# Patient Record
Sex: Female | Born: 1960
Health system: Southern US, Community
[De-identification: ages and names within clinical notes are randomized; demographics above are authoritative.]

---

## 2008-10-26 ENCOUNTER — Emergency Department (HOSPITAL_COMMUNITY): Admission: EM | Admit: 2008-10-26 | Discharge: 2008-10-26 | Payer: Self-pay | Admitting: Emergency Medicine

## 2011-08-22 LAB — CBC
MCHC: 34.3 g/dL (ref 30.0–36.0)
MCV: 93.8 fL (ref 78.0–100.0)
Platelets: 242 10*3/uL (ref 150–400)
RBC: 4 MIL/uL (ref 3.87–5.11)
RDW: 12.7 % (ref 11.5–15.5)

## 2011-08-22 LAB — DIFFERENTIAL
Basophils Absolute: 0 10*3/uL (ref 0.0–0.1)
Basophils Relative: 0 % (ref 0–1)
Eosinophils Absolute: 0.1 10*3/uL (ref 0.0–0.7)
Monocytes Relative: 6 % (ref 3–12)
Neutro Abs: 4.6 10*3/uL (ref 1.7–7.7)
Neutrophils Relative %: 68 % (ref 43–77)

## 2011-08-22 LAB — BASIC METABOLIC PANEL
BUN: 8 mg/dL (ref 6–23)
CO2: 26 mEq/L (ref 19–32)
Calcium: 8.9 mg/dL (ref 8.4–10.5)
Chloride: 107 mEq/L (ref 96–112)
Creatinine, Ser: 0.78 mg/dL (ref 0.4–1.2)
GFR calc Af Amer: >60 mL/min (ref >60)
Glucose, Bld: 118 mg/dL — ABNORMAL HIGH (ref 70–99)

## 2011-08-22 LAB — POCT CARDIAC MARKERS
CKMB, poc: 1.2 ng/mL (ref 1.0–8.0)
Troponin i, poc: <0.05 ng/mL (ref 0.00–0.09)
Troponin i, poc: <0.05 ng/mL (ref 0.00–0.09)

## 2011-08-22 LAB — MAGNESIUM: Magnesium: 2.2 mg/dL (ref 1.5–2.5)

## 2018-07-20 DIAGNOSIS — I491 Atrial premature depolarization: Secondary | ICD-10-CM | POA: Diagnosis not present

## 2018-07-20 DIAGNOSIS — I493 Ventricular premature depolarization: Secondary | ICD-10-CM | POA: Diagnosis not present

## 2018-07-20 DIAGNOSIS — R002 Palpitations: Secondary | ICD-10-CM | POA: Diagnosis not present

## 2018-07-27 DIAGNOSIS — I493 Ventricular premature depolarization: Secondary | ICD-10-CM | POA: Diagnosis not present

## 2018-07-27 DIAGNOSIS — E032 Hypothyroidism due to medicaments and other exogenous substances: Secondary | ICD-10-CM | POA: Diagnosis not present

## 2018-07-30 DIAGNOSIS — Z1231 Encounter for screening mammogram for malignant neoplasm of breast: Secondary | ICD-10-CM | POA: Diagnosis not present

## 2018-08-09 DIAGNOSIS — L821 Other seborrheic keratosis: Secondary | ICD-10-CM | POA: Diagnosis not present

## 2018-08-09 DIAGNOSIS — L700 Acne vulgaris: Secondary | ICD-10-CM | POA: Diagnosis not present

## 2018-08-09 DIAGNOSIS — D225 Melanocytic nevi of trunk: Secondary | ICD-10-CM | POA: Diagnosis not present

## 2018-08-09 DIAGNOSIS — D2371 Other benign neoplasm of skin of right lower limb, including hip: Secondary | ICD-10-CM | POA: Diagnosis not present

## 2018-10-20 DIAGNOSIS — E032 Hypothyroidism due to medicaments and other exogenous substances: Secondary | ICD-10-CM | POA: Diagnosis not present

## 2018-10-20 DIAGNOSIS — I493 Ventricular premature depolarization: Secondary | ICD-10-CM | POA: Diagnosis not present

## 2018-10-20 DIAGNOSIS — R002 Palpitations: Secondary | ICD-10-CM | POA: Diagnosis not present

## 2018-10-25 DIAGNOSIS — E559 Vitamin D deficiency, unspecified: Secondary | ICD-10-CM | POA: Diagnosis not present

## 2018-10-25 DIAGNOSIS — E039 Hypothyroidism, unspecified: Secondary | ICD-10-CM | POA: Diagnosis not present

## 2018-10-25 DIAGNOSIS — R5383 Other fatigue: Secondary | ICD-10-CM | POA: Diagnosis not present

## 2018-10-25 DIAGNOSIS — N951 Menopausal and female climacteric states: Secondary | ICD-10-CM | POA: Diagnosis not present

## 2018-10-25 DIAGNOSIS — E538 Deficiency of other specified B group vitamins: Secondary | ICD-10-CM | POA: Diagnosis not present

## 2018-11-01 DIAGNOSIS — N951 Menopausal and female climacteric states: Secondary | ICD-10-CM | POA: Diagnosis not present

## 2018-11-01 DIAGNOSIS — E538 Deficiency of other specified B group vitamins: Secondary | ICD-10-CM | POA: Diagnosis not present

## 2018-11-01 DIAGNOSIS — E039 Hypothyroidism, unspecified: Secondary | ICD-10-CM | POA: Diagnosis not present

## 2018-11-01 DIAGNOSIS — R7989 Other specified abnormal findings of blood chemistry: Secondary | ICD-10-CM | POA: Diagnosis not present

## 2019-01-03 DIAGNOSIS — Z01419 Encounter for gynecological examination (general) (routine) without abnormal findings: Secondary | ICD-10-CM | POA: Diagnosis not present

## 2019-01-03 DIAGNOSIS — Z6830 Body mass index (BMI) 30.0-30.9, adult: Secondary | ICD-10-CM | POA: Diagnosis not present

## 2019-01-03 DIAGNOSIS — Z0000 Encounter for general adult medical examination without abnormal findings: Secondary | ICD-10-CM | POA: Diagnosis not present

## 2019-03-01 DIAGNOSIS — I493 Ventricular premature depolarization: Secondary | ICD-10-CM | POA: Diagnosis not present

## 2019-03-01 DIAGNOSIS — E032 Hypothyroidism due to medicaments and other exogenous substances: Secondary | ICD-10-CM | POA: Diagnosis not present

## 2019-03-01 DIAGNOSIS — R42 Dizziness and giddiness: Secondary | ICD-10-CM | POA: Diagnosis not present

## 2019-03-01 DIAGNOSIS — R002 Palpitations: Secondary | ICD-10-CM | POA: Diagnosis not present

## 2019-07-20 DIAGNOSIS — E039 Hypothyroidism, unspecified: Secondary | ICD-10-CM | POA: Diagnosis not present

## 2019-07-20 DIAGNOSIS — R5383 Other fatigue: Secondary | ICD-10-CM | POA: Diagnosis not present

## 2019-07-20 DIAGNOSIS — N951 Menopausal and female climacteric states: Secondary | ICD-10-CM | POA: Diagnosis not present

## 2019-07-20 DIAGNOSIS — E559 Vitamin D deficiency, unspecified: Secondary | ICD-10-CM | POA: Diagnosis not present

## 2019-07-20 DIAGNOSIS — E538 Deficiency of other specified B group vitamins: Secondary | ICD-10-CM | POA: Diagnosis not present

## 2019-07-20 DIAGNOSIS — E348 Other specified endocrine disorders: Secondary | ICD-10-CM | POA: Diagnosis not present

## 2019-07-29 DIAGNOSIS — R7989 Other specified abnormal findings of blood chemistry: Secondary | ICD-10-CM | POA: Diagnosis not present

## 2019-07-29 DIAGNOSIS — E559 Vitamin D deficiency, unspecified: Secondary | ICD-10-CM | POA: Diagnosis not present

## 2019-07-29 DIAGNOSIS — E039 Hypothyroidism, unspecified: Secondary | ICD-10-CM | POA: Diagnosis not present

## 2019-07-29 DIAGNOSIS — E538 Deficiency of other specified B group vitamins: Secondary | ICD-10-CM | POA: Diagnosis not present

## 2019-08-09 DIAGNOSIS — D485 Neoplasm of uncertain behavior of skin: Secondary | ICD-10-CM | POA: Diagnosis not present

## 2019-08-09 DIAGNOSIS — D225 Melanocytic nevi of trunk: Secondary | ICD-10-CM | POA: Diagnosis not present

## 2019-08-09 DIAGNOSIS — D2372 Other benign neoplasm of skin of left lower limb, including hip: Secondary | ICD-10-CM | POA: Diagnosis not present

## 2019-08-09 DIAGNOSIS — E508 Other manifestations of vitamin A deficiency: Secondary | ICD-10-CM | POA: Diagnosis not present

## 2019-08-23 DIAGNOSIS — M7662 Achilles tendinitis, left leg: Secondary | ICD-10-CM | POA: Diagnosis not present

## 2019-12-08 DIAGNOSIS — N951 Menopausal and female climacteric states: Secondary | ICD-10-CM | POA: Diagnosis not present

## 2019-12-08 DIAGNOSIS — R5383 Other fatigue: Secondary | ICD-10-CM | POA: Diagnosis not present

## 2019-12-08 DIAGNOSIS — E559 Vitamin D deficiency, unspecified: Secondary | ICD-10-CM | POA: Diagnosis not present

## 2019-12-08 DIAGNOSIS — E039 Hypothyroidism, unspecified: Secondary | ICD-10-CM | POA: Diagnosis not present

## 2019-12-08 DIAGNOSIS — E538 Deficiency of other specified B group vitamins: Secondary | ICD-10-CM | POA: Diagnosis not present

## 2019-12-15 DIAGNOSIS — E039 Hypothyroidism, unspecified: Secondary | ICD-10-CM | POA: Diagnosis not present

## 2019-12-15 DIAGNOSIS — R7989 Other specified abnormal findings of blood chemistry: Secondary | ICD-10-CM | POA: Diagnosis not present

## 2019-12-15 DIAGNOSIS — E538 Deficiency of other specified B group vitamins: Secondary | ICD-10-CM | POA: Diagnosis not present

## 2019-12-15 DIAGNOSIS — F419 Anxiety disorder, unspecified: Secondary | ICD-10-CM | POA: Diagnosis not present

## 2019-12-26 ENCOUNTER — Encounter: Payer: Self-pay | Admitting: Sports Medicine

## 2019-12-26 ENCOUNTER — Other Ambulatory Visit: Payer: Self-pay

## 2019-12-26 ENCOUNTER — Ambulatory Visit (INDEPENDENT_AMBULATORY_CARE_PROVIDER_SITE_OTHER): Payer: BC Managed Care – PPO | Admitting: Sports Medicine

## 2019-12-26 DIAGNOSIS — M67972 Unspecified disorder of synovium and tendon, left ankle and foot: Secondary | ICD-10-CM | POA: Diagnosis not present

## 2019-12-26 DIAGNOSIS — M7752 Other enthesopathy of left foot: Secondary | ICD-10-CM | POA: Insufficient documentation

## 2019-12-26 MED ORDER — NITROGLYCERIN 0.2 MG/HR TD PT24: MEDICATED_PATCH | TRANSDERMAL | 11 refills | Status: AC

## 2019-12-26 NOTE — Progress Notes (Signed)
    Procedures performed today:    None.  Independent interpretation of tests performed by another provider:   None.  Impression and Recommendations:    Insertional Achilles tendinopathy, left For 6 months this pleasant 59 year old female has had pain at the left Achilles insertion, she did see a podiatrist and had some x-rays. Starting relatively conservatively, heel lifts, formal physical therapy, topical nitroglycerin. Return to see me in 1 month, I did advise her that it could take 8+ weeks to really feel a response.    ___________________________________________ Ihor Austin. Benjamin Stain, M.D., ABFM., CAQSM. Primary Care and Sports Medicine [facility]  Adjunct Instructor of Family Medicine  [employer]

## 2019-12-26 NOTE — Assessment & Plan Note (Signed)
For 6 months this pleasant 59 year old female has had pain at the left Achilles insertion, she did see a podiatrist and had some x-rays. Starting relatively conservatively, heel lifts, formal physical therapy, topical nitroglycerin. Return to see me in 1 month, I did advise her that it could take 8+ weeks to really feel a response.

## 2019-12-26 NOTE — Patient Instructions (Signed)
Begin with easy walking, heel, toe and backwards  Do calf raises on a step:  First lower and then raise on 1 foot  If this is painful, lower on 1 foot, do the heel raise on both feet Begin with 3 sets of 10 repetitions  Increase by 5 repetitions every 3 days  Goal is 3 sets of 30 repetitions  Do with both straight knee and knee at 20 degrees of flexion  If pain persists, once you can do 3 sets of 30 without weight, add backpack with 5 lbs.  Increase by 5 lbs per week to max of 30 lbs for 3 sets of 15   

## 2020-01-02 ENCOUNTER — Other Ambulatory Visit: Payer: Self-pay

## 2020-01-02 ENCOUNTER — Ambulatory Visit (INDEPENDENT_AMBULATORY_CARE_PROVIDER_SITE_OTHER): Payer: BC Managed Care – PPO | Admitting: Rehabilitative and Restorative Service Providers"

## 2020-01-02 DIAGNOSIS — M6281 Muscle weakness (generalized): Secondary | ICD-10-CM

## 2020-01-02 DIAGNOSIS — M25572 Pain in left ankle and joints of left foot: Secondary | ICD-10-CM

## 2020-01-02 DIAGNOSIS — R2689 Other abnormalities of gait and mobility: Secondary | ICD-10-CM

## 2020-01-02 NOTE — Patient Instructions (Signed)
Access Code: [email or web address]  URL: [email or web address]  Date: 01/02/2020  Prepared by: Margretta Ditty   Exercises Long Sitting Calf Stretch with Strap - 3 reps - 1 sets - 30 seconds hold - 2x daily - 7x weekly Calf Mobilization with Foam Roll - 1 reps - 1 sets - 2x daily - 7x weekly Sidelying TFL Stretch - 3 reps - 1 sets - 30 seconds hold - 2x daily                            - 7x weekly

## 2020-01-02 NOTE — Therapy (Signed)
[facility] [place] [address] [place], [place], [address] Phone: [phone]   Fax:  [phone]  Physical Therapy Evaluation  Patient Details  Name: Gina Morrison MRN: [id number] Date of Birth: 03-21-1961 Referring Provider (PT): Monica Becton, MD   Encounter Date: 01/02/2020  PT End of Session - 01/02/20 2235    Visit Number  1    Number of Visits  12    Date for PT Re-Evaluation  02/13/20    PT Start Time  0848    PT Stop Time  0934    PT Time Calculation (min)  46 min    Activity Tolerance  Patient tolerated treatment well    Behavior During Therapy  [facility] for tasks assessed/performed       No past medical history on file. There were no vitals filed for this visit.   Subjective Assessment - 01/02/20 0854    Subjective  The patient reports onset of L heel pain in August 2020.  She has pushed through the pain trying ice and stretching.  She recently was prescribed nitro patches to increase blood flow.  She saw a podiatrist and was provided stretching to treat.  She works out with zumba and an exercise bike at home.  The pain is worse going down steps (she has to walk sideways) noting the ankle doesn't feel steady.  She does zumba 2 days/week in a 60-75 minute class.  She feels the R knee is painful due to compensations for L antalgic gait.    Patient Stated Goals  Reduce pain in the L ankle "I want to be able to run if I need to".  She notes tired of alternating how she does things in life.    Currently in Pain?  Yes    Pain Location  Ankle    Pain Orientation  Left    Pain Descriptors / Indicators  Aching;Tightness;Burning;Discomfort    Aggravating Factors   first thing in morning, and after sitting for long periods.  "I can hardly do a flat shoe"    Pain Relieving Factors  as she gets going during the day, it stretches         [facility] PT Assessment - 01/02/20 0901      Assessment   Medical Diagnosis  M67.972  (ICD-10-CM) - Achilles tendon disorder, left    Referring Provider (PT)  Monica Becton, MD    Onset Date/Surgical Date  12/26/19    Prior Therapy  home program from podiatrist in past      Precautions   Precautions  None      Restrictions   Weight Bearing Restrictions  No    Other Position/Activity Restrictions  no      Balance Screen   Has the patient fallen in the past 6 months  No    Has the patient had a decrease in activity level because of a fear of falling?   No    Is the patient reluctant to leave their home because of a fear of falling?   No      Home Environment   Living Environment  Private residence    Living Arrangements  Spouse/significant other;Children    Type of Home  House    Home Layout  Laundry or work area in basement;One level    Home Equipment  None      Prior Function   Level of Independence  Independent    Vocation  Part time employment  Vocation Requirements  travel, standing up for retail    Leisure  zumba, bike      Observation/Other Assessments   Focus on Therapeutic Outcomes (FOTO)   36% limited      Observation/Other Assessments-Edema    Edema  --   mild edema noted lateral to achilles tendon     Sensation   Light Touch  Appears Intact      ROM / Strength   AROM / PROM / Strength  AROM;Strength      AROM   Overall AROM   Deficits    Overall AROM Comments  Patient is able to dorsiflex the L ankle however c/o pain and tightness in L heel cord.    AROM Assessment Site  --    Right/Left Hip  --    Right/Left Ankle  --      Strength   Strength Assessment Site  Hip;Knee;Ankle    Right/Left Hip  Right;Left    Right Hip Flexion  4/5    Left Hip Flexion  4/5    Right/Left Knee  Right;Left    Right Knee Flexion  5/5    Right Knee Extension  5/5    Left Knee Flexion  5/5    Left Knee Extension  5/5    Right/Left Ankle  Right;Left    Right Ankle Dorsiflexion  5/5    Right Ankle Inversion  5/5    Right Ankle Eversion  5/5     Left Ankle Dorsiflexion  5/5    Left Ankle Plantar Flexion  2+/5   unable to perform single leg heel raise   Left Ankle Inversion  5/5    Left Ankle Eversion  5/5      Flexibility   Soft Tissue Assessment /Muscle Length  yes    ITB  tightness noted with trigger points palpated L ITB      Palpation   Palpation comment  L ankle joint mobility limited in heel eversion and in AP/PA glides.  Patient point tender lateral to achilles tendon with area of edema noted.      Ambulation/Gait   Ambulation/Gait  Yes    Ambulation/Gait Assistance  6: Modified independent (Device/Increase time)    Gait Pattern  Decreased stance time - left;Antalgic                Objective measurements completed on examination: See above findings.      [facility] Adult PT Treatment/Exercise - 01/02/20 0902      Self-Care   Self-Care  Other Self-Care Comments    Other Self-Care Comments   modification of zumba in order to avoid high intensity to allow time to reduce inflammation; ice massage for achilles tendon, foam rolling (or using pastry roller) for gastroc and soleous trigger points      Exercises   Exercises  Ankle;Knee/Hip      Knee/Hip Exercises: Stretches   ITB Stretch  Left;2 reps;30 seconds      Ankle Exercises: Stretches   Gastroc Stretch  2 reps;30 seconds    Gastroc Stretch Limitations  in seated position with belt to avoid overstretching    Other Stretch  Attempted ankle self mobilization in 1/2 kneeling adding anterior weight shift (did not provide for HEP due to R knee discomfort -- used foam under knee and still painful).             PT Education - 01/02/20 2235    Education Details  HEP    Person(s) Educated  Patient  Methods  Explanation;Demonstration;Handout    Comprehension  Returned demonstration;Verbalized understanding          PT Long Term Goals - 01/02/20 2302      PT LONG TERM GOAL #1   Title  The patient will be indep with HEP.    Time  6    Period   Weeks    Target Date  02/13/20      PT LONG TERM GOAL #2   Title  The patient will reduce functional limitation per FOTO from 36% to < or equal to 28% to demonstrate improving functional mobility.    Time  6    Period  Weeks    Target Date  02/13/20      PT LONG TERM GOAL #3   Title  The patient will report pain in the morning < or equal to 4/10.    Time  6    Period  Weeks    Target Date  02/13/20      PT LONG TERM GOAL #4   Title  The patient will return demonstrate 5 heel raises in left single limb stance.    Time  6    Period  Weeks    Target Date  02/13/20      PT LONG TERM GOAL #5   Title  The patient will report ability to negotiate steps in a reciprocal pattern without turning sideways to avoid L heel pain.    Time  6    Period  Weeks    Target Date  02/13/20             Plan - 01/02/20 2244    Clinical Impression Statement  The patient is a 59 year old female with chronic L achilles tendonitis presenting to physical therapy with impairments in muscle length gastrocs and soleous, tissue mobility of L ITBand and lower leg, joint mobility of the L ankle, and strength of the L plantar flexors.  This leads to funtional limitations of dec'd standing tolerance for work related activities, difficulty walking after prolonged sitting or riding in a car, and pain with walking intermittently.  The patient is also limited in her exercise routine due to L heel pain.  PT to address deficits to return to reduce pain and return to prior functional status.    Examination-Activity Limitations  Stairs;Locomotion Level    Examination-Participation Restrictions  Community Activity    Stability/Clinical Decision Making  Stable/Uncomplicated    Clinical Decision Making  Low    Rehab Potential  Good    PT Frequency  2x / week    PT Duration  6 weeks    PT Treatment/Interventions  ADLs/Self Care Home Management;Gait training;Iontophoresis 4mg /ml Dexamethasone;Cryotherapy;Moist Heat;Manual  techniques;Taping;Dry needling;Passive range of motion;Patient/family education;Functional mobility training;Stair training;Therapeutic activities;Therapeutic exercise;Neuromuscular re-education    PT Next Visit Plan  document AROM ankle;  ankle joint mobilization, dry needling and STM L gastroc and L soleous, L distal LE strengthening, ITB stretching    PT Home Exercise Plan  Access Code: [email or web address]    Consulted and Agree with Plan of Care  Patient       Patient will benefit from skilled therapeutic intervention in order to improve the following deficits and impairments:  Decreased range of motion, Decreased strength, Impaired flexibility, Pain, Difficulty walking, Increased fascial restricitons, Decreased activity tolerance, Hypomobility  Visit Diagnosis: Pain in left ankle and joints of left foot  Muscle weakness (generalized)  Other abnormalities of gait and mobility  Problem List Patient Active Problem List   Diagnosis Date Noted  . Insertional Achilles tendinopathy, left 12/26/2019    Patrisha Hausmann, PT 01/02/2020, 11:05 PM  [facility] [address] [place], [place], [address] Phone: [phone]   Fax:  [phone]  Name: Gina Morrison MRN: [id number] Date of Birth: [birth year]

## 2020-01-05 ENCOUNTER — Encounter: Payer: BC Managed Care – PPO | Admitting: Physical Therapy

## 2020-01-09 ENCOUNTER — Encounter: Payer: Self-pay | Admitting: Rehabilitative and Restorative Service Providers"

## 2020-01-09 ENCOUNTER — Ambulatory Visit (INDEPENDENT_AMBULATORY_CARE_PROVIDER_SITE_OTHER): Payer: BC Managed Care – PPO | Admitting: Rehabilitative and Restorative Service Providers"

## 2020-01-09 ENCOUNTER — Other Ambulatory Visit: Payer: Self-pay

## 2020-01-09 DIAGNOSIS — M25572 Pain in left ankle and joints of left foot: Secondary | ICD-10-CM

## 2020-01-09 DIAGNOSIS — M6281 Muscle weakness (generalized): Secondary | ICD-10-CM

## 2020-01-09 DIAGNOSIS — R2689 Other abnormalities of gait and mobility: Secondary | ICD-10-CM | POA: Diagnosis not present

## 2020-01-09 NOTE — Patient Instructions (Signed)
Access Code: [email or web address]  URL: [email or web address]  Date: 01/09/2020  Prepared by: Corlis Leak   Exercises  Long Sitting Calf Stretch with Strap - 3 reps - 1 sets - 30 seconds hold - 2x daily - 7x weekly  Calf Mobilization with Foam Roll - 1 reps - 1 sets - 2x daily - 7x weekly  Sidelying TFL Stretch - 3 reps - 1 sets - 30 seconds hold - 2x daily - 7x weekly  Gastroc Stretch on Wall - 3 reps - 1 sets - 30 sec hold - 2x daily - 7x weekly  Soleus Stretch on Wall - 3 reps - 1 sets - 30 sec hold - 2x daily - 7x weekly  Seated Hip Flexor Stretch - 3 reps - 1 sets - 30 sec hold - 2x daily - 7x weekly   Trigger Point Dry Needling  . What is Trigger Point Dry Needling (DN)? o DN is a physical therapy technique used to treat muscle pain and dysfunction. Specifically, DN helps deactivate muscle trigger points (muscle knots).  o A thin filiform needle is used to penetrate the skin and stimulate the underlying trigger point. The goal is for a local twitch response (LTR) to occur and for the trigger point to relax. No medication of any kind is injected during the procedure.   . What Does Trigger Point Dry Needling Feel Like?  o The procedure feels different for each individual patient. Some patients report that they do not actually feel the needle enter the skin and overall the process is not painful. Very mild bleeding may occur. However, many patients feel a deep cramping in the muscle in which the needle was inserted. This is the local twitch response.   Marland Kitchen How Will I feel after the treatment? o Soreness is normal, and the onset of soreness may not occur for a few hours. Typically this soreness does not last longer than two days.  o Bruising is uncommon, however; ice can be used to decrease any possible bruising.  o In rare cases feeling tired or nauseous after the treatment is normal. In addition, your symptoms may get worse before they get better, this period will typically not last  longer than 24 hours.   . What Can I do After My Treatment? o Increase your hydration by drinking more water for the next 24 hours. o You may place ice or heat on the areas treated that have become sore, however, do not use heat on inflamed or bruised areas. Heat often brings more relief post needling. o You can continue your regular activities, but vigorous activity is not recommended initially after the treatment for 24 hours. o DN is best combined with other physical therapy such as strengthening, stretching, and other therapies.

## 2020-01-09 NOTE — Therapy (Signed)
[facility] [address] [place], [place], [address] Phone: [phone]   Fax:  [phone]  Physical Therapy Treatment  Patient Details  Name: Gina Morrison MRN: [id number] Date of Birth: 06-13-1961 Referring Provider (PT): Silverio Decamp, MD   Encounter Date: 01/09/2020  PT End of Session - 01/09/20 3664    Visit Number  2    Number of Visits  12    Date for PT Re-Evaluation  02/13/20    PT Start Time  0932    PT Stop Time  1016    PT Time Calculation (min)  44 min    Activity Tolerance  Patient tolerated treatment well       History reviewed. No pertinent past medical history.  History reviewed. No pertinent surgical history.  There were no vitals filed for this visit.  Subjective Assessment - 01/09/20 0939    Subjective  No change in symptoms - has been doing her exercises and ice massage and riding her stationary bike which feels good. Using nitro patches for heel cord    Currently in Pain?  Yes    Pain Score  7     Pain Orientation  Left    Pain Descriptors / Indicators  Aching;Tightness;Burning;Discomfort    Pain Type  Chronic pain                       [facility] Adult PT Treatment/Exercise - 01/09/20 0001      Knee/Hip Exercises: Stretches   Hip Flexor Stretch  Left;Right;3 reps;30 seconds   sitting     Knee/Hip Exercises: Aerobic   Nustep  L5 x 4 min UE 8       Moist Heat Therapy   Number Minutes Moist Heat  8 Minutes    Moist Heat Location  Other (comment)   Lt calf      Manual Therapy   Manual therapy comments  pr prone LE on bolster     Soft tissue mobilization  deep tissue work/IASTM Lt gastroc/soleus complex     Myofascial Release  Lt calf       Ankle Exercises: Stretches   Soleus Stretch  2 reps;30 seconds   standing    Gastroc Stretch  2 reps;30 seconds    Gastroc Stretch Limitations  standing     Other Stretch  hip flexor stretch seated 30 sec x 2 each side         Trigger Point Dry Needling - 01/09/20 0001    Consent Given?  Yes    Education Handout Provided  Yes    Dry Needling Comments  pt prone LE on bolster DN Lt only     Gastrocnemius Response  Palpable increased muscle length    Soleus Response  Palpable increased muscle length           PT Education - 01/09/20 0950    Education Details  HEP DN    Person(s) Educated  Patient    Methods  Explanation;Demonstration;Tactile cues;Verbal cues;Handout    Comprehension  Verbalized understanding;Returned demonstration;Verbal cues required;Tactile cues required          PT Long Term Goals - 01/02/20 2302      PT LONG TERM GOAL #1   Title  The patient will be indep with HEP.    Time  6    Period  Weeks    Target Date  02/13/20      PT LONG TERM GOAL #2  Title  The patient will reduce functional limitation per FOTO from 36% to < or equal to 28% to demonstrate improving functional mobility.    Time  6    Period  Weeks    Target Date  02/13/20      PT LONG TERM GOAL #3   Title  The patient will report pain in the morning < or equal to 4/10.    Time  6    Period  Weeks    Target Date  02/13/20      PT LONG TERM GOAL #4   Title  The patient will return demonstrate 5 heel raises in left single limb stance.    Time  6    Period  Weeks    Target Date  02/13/20      PT LONG TERM GOAL #5   Title  The patient will report ability to negotiate steps in a reciprocal pattern without turning sideways to avoid L heel pain.    Time  6    Period  Weeks    Target Date  02/13/20            Plan - 01/09/20 0945    Clinical Impression Statement  No change with HEP. Tolerated DN/manual work and additional exercises well. Noted tightness and TP mid to lateral gastroc/soleus complex. Good response to treatment.    Rehab Potential  Good    PT Frequency  2x / week    PT Duration  6 weeks    PT Treatment/Interventions  ADLs/Self Care Home Management;Gait training;Iontophoresis  4mg /ml Dexamethasone;Cryotherapy;Moist Heat;Manual techniques;Taping;Dry needling;Passive range of motion;Patient/family education;Functional mobility training;Stair training;Therapeutic activities;Therapeutic exercise;Neuromuscular re-education    PT Next Visit Plan  document AROM ankle;  ankle joint mobilization, assess response to dry needling and STM L gastroc and L soleous, L distal LE strengthening and TB stretching as indicated    PT Home Exercise Plan  Access Code: [email or web address]    Consulted and Agree with Plan of Care  Patient       Patient will benefit from skilled therapeutic intervention in order to improve the following deficits and impairments:     Visit Diagnosis: Pain in left ankle and joints of left foot  Muscle weakness (generalized)  Other abnormalities of gait and mobility     Problem List Patient Active Problem List   Diagnosis Date Noted  . Insertional Achilles tendinopathy, left 12/26/2019    Paulanthony Gleaves 02/23/2020 PT, MPH  01/09/2020, 10:15 AM  [facility] [address] [place], [place], [place] Phone: [phone]   Fax:  [phone]  Name: Haroldine Redler Wittler MRN: Emilio Math Date of Birth: [birth year]

## 2020-01-12 ENCOUNTER — Encounter: Payer: Self-pay | Admitting: Rehabilitative and Restorative Service Providers"

## 2020-01-12 ENCOUNTER — Ambulatory Visit (INDEPENDENT_AMBULATORY_CARE_PROVIDER_SITE_OTHER): Payer: BC Managed Care – PPO | Admitting: Rehabilitative and Restorative Service Providers"

## 2020-01-12 ENCOUNTER — Other Ambulatory Visit: Payer: Self-pay

## 2020-01-12 DIAGNOSIS — M25572 Pain in left ankle and joints of left foot: Secondary | ICD-10-CM | POA: Diagnosis not present

## 2020-01-12 DIAGNOSIS — R2689 Other abnormalities of gait and mobility: Secondary | ICD-10-CM

## 2020-01-12 DIAGNOSIS — M6281 Muscle weakness (generalized): Secondary | ICD-10-CM

## 2020-01-12 NOTE — Patient Instructions (Addendum)
Access Code: [email or web address]  URL: [email or web address]  Date: 01/12/2020  Prepared by: Margretta Ditty   Exercises Long Sitting Calf Stretch with Strap - 3 reps - 1 sets - 30 seconds hold - 2x daily - 7x weekly Calf Mobilization with Foam Roll - 1 reps - 1 sets - 2x daily - 7x weekly Sidelying TFL Stretch - 3 reps - 1 sets - 30 seconds hold - 2x daily                            - 7x weekly Gastroc Stretch on Wall - 3 reps - 1 sets - 30 sec hold - 2x daily - 7x weekly Soleus Stretch on Wall - 3 reps - 1 sets - 30 sec hold - 2x daily - 7x weekly Seated Hip Flexor Stretch - 3 reps - 1 sets - 30 sec hold - 2x daily - 7x weekly Seated Ankle Alphabet - 10 reps - 1 sets - 2x daily - 7x weekly Seated Plantar Fascia Mobilization with Small Ball - 10 reps - 1 sets - 2x daily - 7x weekly  IONTOPHORESIS PATIENT PRECAUTIONS & CONTRAINDICATIONS   Redness under one or both electrodes can occur.  This is characterized by a uniform redness that usually disappears within 12 hours of treatment.  Small pinhead size blisters may result in response to the drug.  Contact your physician if the problem persists more than 24 hours.  On rare occasions, iontophoresis therapy can result in temporary skin reactions such as rash, inflammation, irritation or burns.  The skin reaction bay be the result of individual sensitivity to the ionic solution used, the condition of the skin at the start of treatment, reaction to the materials in the electrodes, allergies or sensitivity to dexamethasone, or a poor connection between the patch and your skin.  Discontinue using iontophoresis if you have any of these reactions and report to your therapist.  Remove the Patch or electrodes if you have any undue sensation of pain or burning during the treatment and report discomfort to your therapist.  Tell your Therapist if you have had known adverse reactions to the application of electrical current.  If using the Patch,  the LED light will turn off when treatment is complete and the patch can be removed.  Approximate treatment time is 1-3 hours.  Remove the patch when light goes off or after 6 hours.  The Patch can be worn during normal activity, however excessive motion where the electrodes have been placed can cause poor contact between the skin and the electrode or uneven electrical current resulting in greater risk of skin irritation.  Keep out of the reach of children.   DO NOT use if you have a cardiac pacemaker or any other electrically sensitive  Implanted device.  DO NOT use if you have known sensitivity to Dexamethasone.  DO NOT use during Magnetic Resonance Imaging (MRI).  DO NOT use over broken or compromised skin (e.g. Sunburn, cuts or acne) due to the increased risk of skin reaction.  DO NOT SHAVE over the area to be treated:  To establish good contact between the Patch and the skin excessive hair may be clipped.  DO NOT place the Patch or electrodes on or over your eyes, directly over your heart or brain.  DO NOT reuse the Patch or electrodes as this may cause burns to occur.

## 2020-01-12 NOTE — Therapy (Signed)
[place], [place], [address] Phone: [phone]   Fax:  [phone]  Physical Therapy Treatment  Patient Details  Name: Gina Morrison MRN: [id number] Date of Birth: 1960-12-14 Referring Provider (PT): Silverio Decamp, MD   Encounter Date: 01/12/2020  PT End of Session - 01/12/20 1056    Visit Number  3    Number of Visits  12    Date for PT Re-Evaluation  02/13/20    PT Start Time  0932    PT Stop Time  9242    PT Time Calculation (min)  42 min    Activity Tolerance  Patient tolerated treatment well       History reviewed. No pertinent past medical history.  History reviewed. No pertinent surgical history.  There were no vitals filed for this visit.  Subjective Assessment - 01/12/20 0933    Subjective  The patient reports she was sore for 2 days after dry needling.  She arrives with antalgic gait pattern.    Patient Stated Goals  Reduce pain in the L ankle "I want to be able to run if I need to".  She notes tired of alternating how she does things in life.    Currently in Pain?  Yes    Pain Score  --   no current pain at rest; gets excruciating burning at times   Pain Location  Ankle    Pain Descriptors / Indicators  Tender;Burning;Aching    Pain Type  Chronic pain    Pain Onset  More than a month ago         [facility] PT Assessment - 01/12/20 0944      AROM   Left Ankle Dorsiflexion  0    Left Ankle Plantar Flexion  45                   [facility] Adult PT Treatment/Exercise - 01/12/20 0941      Exercises   Exercises  Ankle      Modalities   Modalities  Iontophoresis      Iontophoresis   Type of Iontophoresis  Dexamethasone    Location  L heel cord    Dose  1.0cc    Time  12 hour patch      Manual Therapy   Manual Therapy  Joint mobilization;Soft tissue mobilization;Myofascial release    Manual therapy comments  Patient prone for STM and joint mobility and  then performed standing mobilization with movement    Joint Mobilization  PRONE: Grade II-III lateral ankle mortisse mobilization, AP and PA grade II-III, and grade IV subtalar mobilization   STANDING:  PT used gait belt around tibia/fibular complex and had patient step down with R LE from 3" step and stabilized ankle to get tibia movement along ankle mortisse    Soft tissue mobilization  deep tissue work IASTM in Horticulturist, commercial and soleous complex in prone and in SL (to better access soleous)    Myofascial Release  L calf      Ankle Exercises: Stretches   Plantar Fascia Stretch  30 seconds    Plantar Fascia Stretch Limitations  tennis ball    Other Stretch  Seated toe extensor stretch      Ankle Exercises: Seated   ABC's  1 rep    Ankle Circles/Pumps  AROM;Left;10 reps    BAPS  Sitting;Level 2;15 reps    BAPS Limitations  ant/post and laterally  PT Education - 01/12/20 1056    Education Details  added ankle alphabet, info on iontophoresis,  tennis ball plantar roll    Person(s) Educated  Patient    Methods  Explanation;Demonstration;Handout    Comprehension  Returned demonstration;Verbalized understanding          PT Long Term Goals - 01/02/20 2302      PT LONG TERM GOAL #1   Title  The patient will be indep with HEP.    Time  6    Period  Weeks    Target Date  02/13/20      PT LONG TERM GOAL #2   Title  The patient will reduce functional limitation per FOTO from 36% to < or equal to 28% to demonstrate improving functional mobility.    Time  6    Period  Weeks    Target Date  02/13/20      PT LONG TERM GOAL #3   Title  The patient will report pain in the morning < or equal to 4/10.    Time  6    Period  Weeks    Target Date  02/13/20      PT LONG TERM GOAL #4   Title  The patient will return demonstrate 5 heel raises in left single limb stance.    Time  6    Period  Weeks    Target Date  02/13/20      PT LONG TERM GOAL #5   Title  The patient will  report ability to negotiate steps in a reciprocal pattern without turning sideways to avoid L heel pain.    Time  6    Period  Weeks    Target Date  02/13/20            Plan - 01/12/20 1325    Clinical Impression Statement  The patient is point tender lateral to left heel cord and PT initiated iontophoresis (12 hr) to L heel cord to reduce inflammation.  She noted improved mobility after PT treatment today feeling soft tissue work and mobilization allows for greater movement.    Rehab Potential  Good    PT Frequency  2x / week    PT Duration  6 weeks    PT Treatment/Interventions  ADLs/Self Care Home Management;Gait training;Iontophoresis 4mg /ml Dexamethasone;Cryotherapy;Moist Heat;Manual techniques;Taping;Dry needling;Passive range of motion;Patient/family education;Functional mobility training;Stair training;Therapeutic activities;Therapeutic exercise;Neuromuscular re-education    PT Next Visit Plan  ankle joint mobilization, assess response to dry needling and STM L gastroc and L soleous, L distal LE strengthening and TB stretching as indicated    PT Home Exercise Plan  Access Code: [email or web address]    Consulted and Agree with Plan of Care  Patient       Patient will benefit from skilled therapeutic intervention in order to improve the following deficits and impairments:  Decreased range of motion, Decreased strength, Impaired flexibility, Pain, Difficulty walking, Increased fascial restricitons, Decreased activity tolerance, Hypomobility  Visit Diagnosis: Pain in left ankle and joints of left foot  Muscle weakness (generalized)  Other abnormalities of gait and mobility     Problem List Patient Active Problem List   Diagnosis Date Noted  . Insertional Achilles tendinopathy, left 12/26/2019    Kasandra Fehr, PT 01/12/2020, 1:29 PM  [facility] [address] [place], [place], [place] Phone: [phone]   Fax:   [phone]  Name: Gina Morrison MRN: Emilio Math Date of Birth: [birth year]

## 2020-01-18 ENCOUNTER — Ambulatory Visit (INDEPENDENT_AMBULATORY_CARE_PROVIDER_SITE_OTHER): Payer: BC Managed Care – PPO | Admitting: Physical Therapy

## 2020-01-18 ENCOUNTER — Encounter: Payer: Self-pay | Admitting: Physical Therapy

## 2020-01-18 ENCOUNTER — Other Ambulatory Visit: Payer: Self-pay

## 2020-01-18 DIAGNOSIS — M6281 Muscle weakness (generalized): Secondary | ICD-10-CM

## 2020-01-18 DIAGNOSIS — M25572 Pain in left ankle and joints of left foot: Secondary | ICD-10-CM | POA: Diagnosis not present

## 2020-01-18 DIAGNOSIS — R2689 Other abnormalities of gait and mobility: Secondary | ICD-10-CM

## 2020-01-18 NOTE — Therapy (Signed)
[place], [place], [address] Phone: [phone]   Fax:  [phone]  Physical Therapy Treatment  Patient Details  Name: Gina Morrison MRN: [id number] Date of Birth: 01-22-1961 Referring Provider (PT): Silverio Decamp, MD   Encounter Date: 01/18/2020  PT End of Session - 01/18/20 0934    Visit Number  4    Number of Visits  12    Date for PT Re-Evaluation  02/13/20    PT Start Time  0934    PT Stop Time  1022    PT Time Calculation (min)  48 min    Activity Tolerance  Patient tolerated treatment well    Behavior During Therapy  [facility] for tasks assessed/performed       History reviewed. No pertinent past medical history.  History reviewed. No pertinent surgical history.  There were no vitals filed for this visit.  Subjective Assessment - 01/18/20 0936    Subjective  Pt reports that her ankle is starting to go in the right direction - if afraid she may have had a bad reaction to the ionto patch at about 10 hrs on ( 12 hr patch ) She had an increase in her HR. Pt would like to hold off on having another patch for now.    Patient Stated Goals  Reduce pain in the L ankle "I want to be able to run if I need to".  She notes tired of alternating how she does things in life.    Currently in Pain?  Yes    Pain Score  --   starts at 8/10 decreases to 3/10 with loosening it up   Pain Location  Ankle    Pain Orientation  Left    Pain Descriptors / Indicators  Tightness    Pain Type  Chronic pain    Aggravating Factors   walking    Pain Relieving Factors  stretches and gently motion                       [facility] Adult PT Treatment/Exercise - 01/18/20 0001      Self-Care   Other Self-Care Comments   foam rolling on Lt gastroc       Knee/Hip Exercises: Stretches   Active Hamstring Stretch  Left   20 reps knee presses with  strap      Knee/Hip Exercises: Aerobic   Recumbent Bike   L2x6'      Modalities   Modalities  Moist Heat      Moist Heat Therapy   Number Minutes Moist Heat  12 Minutes    Moist Heat Location  --   Lt calf      Manual Therapy   Manual therapy comments  skilled palpation and montioring during DN    Soft tissue mobilization  STM to Lt gastroc/soleous and anterior tib       Trigger Point Dry Needling - 01/18/20 0001    Consent Given?  Yes    Education Handout Provided  Previously provided    Dry Needling Comments  pt prone LE DN Lt only     Electrical Stimulation Performed with Dry Needling  Yes    E-stim with Dry Needling Details  2 channels to Rt gastroc                PT Long Term Goals - 01/02/20 2302      PT LONG TERM GOAL #1  Title  The patient will be indep with HEP.    Time  6    Period  Weeks    Target Date  02/13/20      PT LONG TERM GOAL #2   Title  The patient will reduce functional limitation per FOTO from 36% to < or equal to 28% to demonstrate improving functional mobility.    Time  6    Period  Weeks    Target Date  02/13/20      PT LONG TERM GOAL #3   Title  The patient will report pain in the morning < or equal to 4/10.    Time  6    Period  Weeks    Target Date  02/13/20      PT LONG TERM GOAL #4   Title  The patient will return demonstrate 5 heel raises in left single limb stance.    Time  6    Period  Weeks    Target Date  02/13/20      PT LONG TERM GOAL #5   Title  The patient will report ability to negotiate steps in a reciprocal pattern without turning sideways to avoid L heel pain.    Time  6    Period  Weeks    Target Date  02/13/20            Plan - 01/18/20 1013    Clinical Impression Statement  Pt had good release with DN, able to have increased movement after and less pain.  She requests to hold on iontopatch for now.  She will continue to use her nitro patch.    Rehab Potential  Good    PT Frequency  2x / week    PT Duration  6 weeks    PT Treatment/Interventions   ADLs/Self Care Home Management;Gait training;Iontophoresis 4mg /ml Dexamethasone;Cryotherapy;Moist Heat;Manual techniques;Taping;Dry needling;Passive range of motion;Patient/family education;Functional mobility training;Stair training;Therapeutic activities;Therapeutic exercise;Neuromuscular re-education    PT Next Visit Plan  ankle joint mobilization, assess response to dry needling and STM L gastroc and L soleous, L distal LE strengthening and TB stretching as indicated    Consulted and Agree with Plan of Care  Patient       Patient will benefit from skilled therapeutic intervention in order to improve the following deficits and impairments:  Decreased range of motion, Decreased strength, Impaired flexibility, Pain, Difficulty walking, Increased fascial restricitons, Decreased activity tolerance, Hypomobility  Visit Diagnosis: Pain in left ankle and joints of left foot  Muscle weakness (generalized)  Other abnormalities of gait and mobility     Problem List Patient Active Problem List   Diagnosis Date Noted  . Insertional Achilles tendinopathy, left 12/26/2019    02/23/2020 PT  01/18/2020, 10:15 AM  [facility] [address] [place], [place], [place] Phone: [phone]   Fax:  [phone]  Name: Karessa Onorato Molyneux MRN: Emilio Math Date of Birth: 12/07/1960

## 2020-01-19 DIAGNOSIS — Z1231 Encounter for screening mammogram for malignant neoplasm of breast: Secondary | ICD-10-CM | POA: Diagnosis not present

## 2020-01-23 ENCOUNTER — Ambulatory Visit: Payer: BC Managed Care – PPO | Admitting: Sports Medicine

## 2020-01-25 ENCOUNTER — Ambulatory Visit (INDEPENDENT_AMBULATORY_CARE_PROVIDER_SITE_OTHER): Payer: BC Managed Care – PPO | Admitting: Physical Therapy

## 2020-01-25 ENCOUNTER — Other Ambulatory Visit: Payer: Self-pay

## 2020-01-25 ENCOUNTER — Encounter: Payer: Self-pay | Admitting: Physical Therapy

## 2020-01-25 DIAGNOSIS — R2689 Other abnormalities of gait and mobility: Secondary | ICD-10-CM

## 2020-01-25 DIAGNOSIS — M6281 Muscle weakness (generalized): Secondary | ICD-10-CM

## 2020-01-25 DIAGNOSIS — M25572 Pain in left ankle and joints of left foot: Secondary | ICD-10-CM

## 2020-01-25 NOTE — Therapy (Signed)
[place], [place], [address] Phone: [phone]   Fax:  [phone]  Physical Therapy Treatment  Patient Details  Name: Gina Morrison MRN: [id number] Date of Birth: 11-23-1960 Referring Provider (PT): Silverio Decamp, MD   Encounter Date: 01/25/2020  PT End of Session - 01/25/20 0935    Visit Number  5    Number of Visits  12    Date for PT Re-Evaluation  02/13/20    PT Start Time  0936    PT Stop Time  1017    PT Time Calculation (min)  41 min       History reviewed. No pertinent past medical history.  History reviewed. No pertinent surgical history.  There were no vitals filed for this visit.  Subjective Assessment - 01/25/20 0936    Subjective  Pt reports she is doing good however feels like the area is swollen.  Has been doing her exercise.  Driving made it a little sore    Patient Stated Goals  Reduce pain in the L ankle "I want to be able to run if I need to".  She notes tired of alternating how she does things in life.    Currently in Pain?  Yes    Pain Score  --   6/10 with walking, 3/10 with rest        [facility] PT Assessment - 01/25/20 0001      Assessment   Medical Diagnosis  M67.972 (ICD-10-CM) - Achilles tendon disorder, left    Referring Provider (PT)  Silverio Decamp, MD    Next MD Visit  needs to reschedule      AROM   Left Ankle Dorsiflexion  12                   [facility] Adult PT Treatment/Exercise - 01/25/20 0001      Knee/Hip Exercises: Aerobic   Recumbent Bike  L2x6'      Knee/Hip Exercises: Standing   SLS  Lt with FWD leans 3x10    Rebounder  Lt LE foot straight and 45 degrees each side      Modalities   Modalities  Ultrasound      Ultrasound   Ultrasound Location  Lt achilles    Ultrasound Parameters  3' static, 10%, 3.23mz, 0.80w/cm2, 4' 3.325m, 1.0w/cm2    Ultrasound Goals  Pain;Edema      Manual Therapy   Manual Therapy   Taping    Kinesiotex  Create Space;Inhibit Muscle      Kinesiotix   Create Space  i strip across Lt achilles    Inhibit Muscle   Y strip up Lt achilles      Ankle Exercises: Seated   BAPS  Sitting;Level 4   20 reps   BAPS Limitations  clock 6/12, 3/9 and circles. VC to isolate ankle motion and not use hip                  PT Long Term Goals - 01/25/20 0939      PT LONG TERM GOAL #1   Title  The patient will be indep with HEP.    Status  On-going      PT LONG TERM GOAL #2   Title  The patient will reduce functional limitation per FOTO from 36% to < or equal to 28% to demonstrate improving functional mobility.    Status  On-going      PT LONG  TERM GOAL #3   Title  The patient will report pain in the morning < or equal to 4/10.    Baseline  pt reports no pain when she first wakes up 6/10 when she gets out of bed and starts walking    Status  On-going      PT LONG TERM GOAL #4   Title  The patient will return demonstrate 5 heel raises in left single limb stance.    Baseline  unable to perform one single 8/10 pain    Status  On-going      PT LONG TERM GOAL #5   Title  The patient will report ability to negotiate steps in a reciprocal pattern without turning sideways to avoid L heel pain.    Baseline  able to do this however does have some pain    Status  Achieved            Plan - 01/25/20 1148    Clinical Impression Statement  Pt has tried multiple things to reduce her pain, she is having slow improvement.  Tried ultrasound and taping today. Pt reported that it felt good at end.  May benefit from instruction to self tape.  She has partially met a goal and has imporved DF    Rehab Potential  Good    PT Frequency  2x / week    PT Duration  6 weeks    PT Treatment/Interventions  ADLs/Self Care Home Management;Gait training;Iontophoresis 37m/ml Dexamethasone;Cryotherapy;Moist Heat;Manual techniques;Taping;Dry needling;Passive range of motion;Patient/family  education;Functional mobility training;Stair training;Therapeutic activities;Therapeutic exercise;Neuromuscular re-education    PT Next Visit Plan  teach self taping if it helped, assess response to [place]   Consulted and Agree with Plan of Care  Patient       Patient will benefit from skilled therapeutic intervention in order to improve the following deficits and impairments:  Decreased range of motion, Decreased strength, Impaired flexibility, Pain, Difficulty walking, Increased fascial restricitons, Decreased activity tolerance, Hypomobility  Visit Diagnosis: Pain in left ankle and joints of left foot  Muscle weakness (generalized)  Other abnormalities of gait and mobility     Problem List Patient Active Problem List   Diagnosis Date Noted  . Insertional Achilles tendinopathy, left 12/26/2019    SJeral PinchPT  01/25/2020, 11:51 AM  [facility] [address]Phone: [phone]  Fax:  [phone] Name: Gina Morrison MRN: [id number] of Birth: [birth year]

## 2020-01-25 NOTE — Patient Instructions (Signed)
Access Code: [email or web address] URL: [email or web address] Date: 01/25/2020 Prepared by: Roderic Scarce  Exercises Forward T - 10 reps - 3 sets - 1x daily

## 2020-01-31 DIAGNOSIS — Z0000 Encounter for general adult medical examination without abnormal findings: Secondary | ICD-10-CM | POA: Diagnosis not present

## 2020-01-31 DIAGNOSIS — Z6832 Body mass index (BMI) 32.0-32.9, adult: Secondary | ICD-10-CM | POA: Diagnosis not present

## 2020-01-31 DIAGNOSIS — Z01419 Encounter for gynecological examination (general) (routine) without abnormal findings: Secondary | ICD-10-CM | POA: Diagnosis not present

## 2020-02-01 ENCOUNTER — Other Ambulatory Visit: Payer: Self-pay

## 2020-02-01 ENCOUNTER — Encounter: Payer: Self-pay | Admitting: Rehabilitative and Restorative Service Providers"

## 2020-02-01 ENCOUNTER — Ambulatory Visit (INDEPENDENT_AMBULATORY_CARE_PROVIDER_SITE_OTHER): Payer: BC Managed Care – PPO | Admitting: Rehabilitative and Restorative Service Providers"

## 2020-02-01 DIAGNOSIS — R2689 Other abnormalities of gait and mobility: Secondary | ICD-10-CM | POA: Diagnosis not present

## 2020-02-01 DIAGNOSIS — M25572 Pain in left ankle and joints of left foot: Secondary | ICD-10-CM

## 2020-02-01 DIAGNOSIS — M6281 Muscle weakness (generalized): Secondary | ICD-10-CM

## 2020-02-01 NOTE — Therapy (Signed)
[facility] [place] [address] [place], [place], [address] Phone: [phone]   Fax:  [phone]  Physical Therapy Treatment  Patient Details  Name: Gina Morrison MRN: [id number] Date of Birth: [birth year] Referring Provider (PT): Monica Becton, MD   Encounter Date: 02/01/2020  PT End of Session - 02/01/20 0935    Visit Number  6    Number of Visits  12    Date for PT Re-Evaluation  02/13/20    PT Start Time  0933    PT Stop Time  1015    PT Time Calculation (min)  42 min    Activity Tolerance  Patient tolerated treatment well       History reviewed. No pertinent past medical history.  History reviewed. No pertinent surgical history.  There were no vitals filed for this visit.  Subjective Assessment - 02/01/20 0935    Subjective  Tape has helped. Had a bit of a reaction to the ionto patch - some heart palpatations that evening. She has shoes with a slightly lifted heel and using wedges at home which has helped. 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    Currently in Pain?  Yes    Pain Score  6     Pain Location  Ankle    Pain Orientation  Left    Pain Descriptors / Indicators  Tightness    Pain Type  Chronic pain    Pain Onset  More than a month ago    Pain Frequency  Intermittent    Aggravating Factors   sitting still; initial walking    Pain Relieving Factors  stretching and gently movement                       [facility] Adult PT Treatment/Exercise - 02/01/20 0001      Knee/Hip Exercises: Aerobic   Recumbent Bike  L2x6'      Ultrasound   Ultrasound Location  Lt achilles     Ultrasound Parameters  8 min 50% 1.0 w/cm2    Ultrasound Goals  Pain;Edema      Iontophoresis   Type of Iontophoresis  --   hold due to reaction - see note      Manual Therapy   Manual Therapy  Taping    Soft tissue mobilization  deep tissue work Forensic scientist with IASTM     Myofascial Release  posterior  Lt calf     Publishing rights manager;Inhibit Muscle      Kinesiotix   Create Space  I strip across the Lt achilles     Inhibit Muscle   Y strip up Lt achilles      Ankle Exercises: Stretches   Soleus Stretch  2 reps;30 seconds    Gastroc Stretch  2 reps;30 seconds    Gastroc Stretch Limitations  standing       Ankle Exercises: Standing   SLS  Lt 15 - 20 sec x 2; Lt with movement of Rt LE 20-30 sec x 2 - UE support as needed for balance     Rebounder  SLS Lt foot straight; 45 deg Rt; 45 deg Lt  - 25 reps each     Other Standing Ankle Exercises  SLS with forward leans - x 5 reps                   PT Long Term Goals - 01/25/20 9628  PT LONG TERM GOAL #1   Title  The patient will be indep with HEP.    Status  On-going      PT LONG TERM GOAL #2   Title  The patient will reduce functional limitation per FOTO from 36% to < or equal to 28% to demonstrate improving functional mobility.    Status  On-going      PT LONG TERM GOAL #3   Title  The patient will report pain in the morning < or equal to 4/10.    Baseline  pt reports no pain when she first wakes up 6/10 when she gets out of bed and starts walking    Status  On-going      PT LONG TERM GOAL #4   Title  The patient will return demonstrate 5 heel raises in left single limb stance.    Baseline  unable to perform one single 8/10 pain    Status  On-going      PT LONG TERM GOAL #5   Title  The patient will report ability to negotiate steps in a reciprocal pattern without turning sideways to avoid L heel pain.    Baseline  able to do this however does have some pain    Status  Achieved            Plan - 02/01/20 0954    Clinical Impression Statement  Gradual improvment. Less pain and improved functional tolerance. Good reponse to manual work/US and taping. Hold ionto due to posible reaction. Progressing gradually toward stated goals of therapy.    Rehab Potential  Good    PT Frequency  2x / week    PT  Duration  6 weeks    PT Treatment/Interventions  ADLs/Self Care Home Management;Gait training;Iontophoresis 4mg /ml Dexamethasone;Cryotherapy;Moist Heat;Manual techniques;Taping;Dry needling;Passive range of motion;Patient/family education;Functional mobility training;Stair training;Therapeutic activities;Therapeutic exercise;Neuromuscular re-education    PT Next Visit Plan  continue US/manual work/taping - exercise    PT Home Exercise Plan  Access Code: [email or web address]    Consulted and Agree with Plan of Care  Patient       Patient will benefit from skilled therapeutic intervention in order to improve the following deficits and impairments:     Visit Diagnosis: Pain in left ankle and joints of left foot  Muscle weakness (generalized)  Other abnormalities of gait and mobility     Problem List Patient Active Problem List   Diagnosis Date Noted  . Insertional Achilles tendinopathy, left 12/26/2019     Nilda Simmer PT, MPH  02/01/2020, 10:34 AM  [facility] [place], [place], [address] Phone: [phone]   Fax:  [phone]  Name: Gina Morrison MRN: [id number] Date of Birth: [birth year]

## 2020-02-08 ENCOUNTER — Ambulatory Visit (INDEPENDENT_AMBULATORY_CARE_PROVIDER_SITE_OTHER): Payer: BC Managed Care – PPO | Admitting: Rehabilitative and Restorative Service Providers"

## 2020-02-08 ENCOUNTER — Other Ambulatory Visit: Payer: Self-pay

## 2020-02-08 ENCOUNTER — Encounter: Payer: Self-pay | Admitting: Rehabilitative and Restorative Service Providers"

## 2020-02-08 DIAGNOSIS — M25572 Pain in left ankle and joints of left foot: Secondary | ICD-10-CM | POA: Diagnosis not present

## 2020-02-08 DIAGNOSIS — M6281 Muscle weakness (generalized): Secondary | ICD-10-CM

## 2020-02-08 DIAGNOSIS — R2689 Other abnormalities of gait and mobility: Secondary | ICD-10-CM | POA: Diagnosis not present

## 2020-02-08 NOTE — Patient Instructions (Signed)
Step down from 2-4 inches 10-15 reps   Lateral heel tap  10-15 reps

## 2020-02-08 NOTE — Therapy (Signed)
[facility] [place] [address] [place], [place], [address] Phone: [phone]   Fax:  [phone]  Physical Therapy Treatment  Patient Details  Name: Gina Morrison MRN: [id number] Date of Birth: [birth year] Referring Provider (PT): Monica Becton, MD   Encounter Date: 02/08/2020  PT End of Session - 02/08/20 0940    Visit Number  7    Number of Visits  12    Date for PT Re-Evaluation  02/13/20    PT Start Time  0936    PT Stop Time  1016    PT Time Calculation (min)  40 min    Activity Tolerance  Patient tolerated treatment well       History reviewed. No pertinent past medical history.  History reviewed. No pertinent surgical history.  There were no vitals filed for this visit.  Subjective Assessment - 02/08/20 0942    Subjective  Improving  - able to do zumba last night and it was not bad. Does not have f/u appt with Dr T yet. Still having most difficulty descending steps    Currently in Pain?  Yes    Pain Score  4     Pain Location  Ankle    Pain Orientation  Left    Pain Descriptors / Indicators  Tightness    Pain Type  Chronic pain    Pain Onset  More than a month ago                       [facility] Adult PT Treatment/Exercise - 02/08/20 0001      Knee/Hip Exercises: Aerobic   Recumbent Bike  L2x6'      Ultrasound   Ultrasound Location  Lt achilles     Ultrasound Parameters  8 min 50%; 1.0 w/cm2    Ultrasound Goals  Pain;Edema      Manual Therapy   Manual Therapy  Taping    Joint Mobilization  PRONE: Grade II-III lateral ankle mortisse mobilization, AP and PA grade II-III, and grade IV subtalar mobilization   STANDING:  PT used gait belt around tibia/fibular complex and had patient step down with R LE from 3" step and stabilized ankle to get tibia movement along ankle mortisse    Soft tissue mobilization  deep tissue work Forensic scientist with IASTM     Myofascial Release  posterior  Lt calf     Publishing rights manager;Inhibit Muscle      Kinesiotix   Create Space  I strip across the Lt achilles     Inhibit Muscle   Y strip up Lt achilles      Ankle Exercises: Stretches   Soleus Stretch  2 reps;30 seconds    Gastroc Stretch  2 reps;30 seconds    Gastroc Stretch Limitations  standing     Slant Board Stretch  2 reps;60 seconds    Other Stretch  DF stretch in sitting 20-30 sec x 3 reps     Other Stretch  hip flexor stretch seated 30 sec x 2 each side       Ankle Exercises: Standing   SLS  Lt 15 - 20 sec x 2; Lt with movement of Rt LE 20-30 sec x 2 - UE support as needed for balance     Rebounder  SLS Lt foot straight; 45 deg Rt; 45 deg Lt  - 25 reps each     Other Standing Ankle Exercises  SLS with forward leans -  x 5 reps     Other Standing Ankle Exercises  lateral step down heel tap on the Rt to improve DF on the right; step down w/ Rt foot stepping down - 4 inch step x 15-20 reps              PT Education - 02/08/20 1131    Education Details  HEP          PT Long Term Goals - 01/25/20 0939      PT LONG TERM GOAL #1   Title  The patient will be indep with HEP.    Status  On-going      PT LONG TERM GOAL #2   Title  The patient will reduce functional limitation per FOTO from 36% to < or equal to 28% to demonstrate improving functional mobility.    Status  On-going      PT LONG TERM GOAL #3   Title  The patient will report pain in the morning < or equal to 4/10.    Baseline  pt reports no pain when she first wakes up 6/10 when she gets out of bed and starts walking    Status  On-going      PT LONG TERM GOAL #4   Title  The patient will return demonstrate 5 heel raises in left single limb stance.    Baseline  unable to perform one single 8/10 pain    Status  On-going      PT LONG TERM GOAL #5   Title  The patient will report ability to negotiate steps in a reciprocal pattern without turning sideways to avoid L heel pain.    Baseline  able to  do this however does have some pain    Status  Achieved            Plan - 02/08/20 1131    Clinical Impression Statement  Continued gradual improvement with decreased ankle pain and improved functional activity level. Patient has greatest difficulty with descending steps. She was able to return to zumba class yesterday. Patient has responded well to treatment. She is workiong well toward rehab goals.    Rehab Potential  Good    PT Frequency  2x / week    PT Duration  6 weeks    PT Treatment/Interventions  ADLs/Self Care Home Management;Gait training;Iontophoresis 4mg /ml Dexamethasone;Cryotherapy;Moist Heat;Manual techniques;Taping;Dry needling;Passive range of motion;Patient/family education;Functional mobility training;Stair training;Therapeutic activities;Therapeutic exercise;Neuromuscular re-education    PT Next Visit Plan  continue US/manual work/taping - exercise    PT Home Exercise Plan  Access Code: [email or web address]    Consulted and Agree with Plan of Care  Patient       Patient will benefit from skilled therapeutic intervention in order to improve the following deficits and impairments:     Visit Diagnosis: Pain in left ankle and joints of left foot  Muscle weakness (generalized)  Other abnormalities of gait and mobility     Problem List Patient Active Problem List   Diagnosis Date Noted  . Insertional Achilles tendinopathy, left 12/26/2019    Gina Morrison PT, MPH  02/08/2020, 11:34 AM  [facility] [place], [place], [address] Phone: [phone]   Fax:  [phone]  Name: Gina Morrison MRN: [id number] Date of Birth: 01/28/1961

## 2020-02-15 ENCOUNTER — Encounter: Payer: Self-pay | Admitting: Rehabilitative and Restorative Service Providers"

## 2020-02-15 ENCOUNTER — Other Ambulatory Visit: Payer: Self-pay

## 2020-02-15 ENCOUNTER — Encounter: Payer: Self-pay | Admitting: Sports Medicine

## 2020-02-15 ENCOUNTER — Ambulatory Visit (INDEPENDENT_AMBULATORY_CARE_PROVIDER_SITE_OTHER): Payer: BC Managed Care – PPO | Admitting: Sports Medicine

## 2020-02-15 ENCOUNTER — Ambulatory Visit (INDEPENDENT_AMBULATORY_CARE_PROVIDER_SITE_OTHER): Payer: BC Managed Care – PPO | Admitting: Rehabilitative and Restorative Service Providers"

## 2020-02-15 DIAGNOSIS — R2689 Other abnormalities of gait and mobility: Secondary | ICD-10-CM

## 2020-02-15 DIAGNOSIS — M25572 Pain in left ankle and joints of left foot: Secondary | ICD-10-CM | POA: Diagnosis not present

## 2020-02-15 DIAGNOSIS — M6281 Muscle weakness (generalized): Secondary | ICD-10-CM | POA: Diagnosis not present

## 2020-02-15 DIAGNOSIS — M67972 Unspecified disorder of synovium and tendon, left ankle and foot: Secondary | ICD-10-CM

## 2020-02-15 NOTE — Assessment & Plan Note (Addendum)
This is a pleasant 59 year old female, for 6 to 8 months she has had pain in her left Achilles insertion, she has had evaluation with a podiatrist, she is had x-rays. We started conservatively with heel lifts, formal PT, topical nitroglycerin. Her symptoms have not been present for 2 months, and she is not really having much improvement. She is now a candidate for retrocalcaneal bursa injection with Achilles protection in a cam boot for 1 week afterwards. We will schedule this for Monday of next week per her request. She tells me she does have a cam boot at home that she will bring with her.

## 2020-02-15 NOTE — Progress Notes (Signed)
    Procedures performed today:    None.  Independent interpretation of notes and tests performed by another provider:   None.  Brief History, Exam, Impression, and Recommendations:    Insertional Achilles tendinopathy, left This is a pleasant 59 year old female, for 6 to 8 months she has had pain in her left Achilles insertion, she has had evaluation with a podiatrist, she is had x-rays. We started conservatively with heel lifts, formal PT, topical nitroglycerin. Her symptoms have not been present for 2 months, and she is not really having much improvement. She is now a candidate for retrocalcaneal bursa injection with Achilles protection in a cam boot for 1 week afterwards. We will schedule this for Monday of next week per her request. She tells me she does have a cam boot at home that she will bring with her.    ___________________________________________ Ihor Austin. Benjamin Stain, M.D., ABFM., CAQSM. Primary Care and Sports Medicine [facility]  Adjunct Instructor of Family Medicine  [employer]

## 2020-02-15 NOTE — Therapy (Signed)
[place], [place], [address] Phone: [phone]   Fax:  [phone]  Physical Therapy Treatment  Patient Details  Name: Gina Morrison MRN: [id number] Date of Birth: [birth year] Referring Provider (PT): Silverio Decamp, MD   Encounter Date: 02/15/2020  PT End of Session - 02/15/20 0936    Visit Number  8    Number of Visits  12    Date for PT Re-Evaluation  02/13/20    PT Start Time  0934    PT Stop Time  1026    PT Time Calculation (min)  52 min    Activity Tolerance  Patient tolerated treatment well       History reviewed. No pertinent past medical history.  History reviewed. No pertinent surgical history.  There were no vitals filed for this visit.  Subjective Assessment - 02/15/20 0937    Subjective  Bad day Monday - on her feet a lot at work, walking more than usual. On her feet for 12-15 hours a day. Sees MD today.    Currently in Pain?  Yes    Pain Score  6     Pain Location  Ankle    Pain Orientation  Left    Pain Descriptors / Indicators  Tightness    Pain Type  Chronic pain    Pain Onset  More than a month ago    Pain Frequency  Intermittent    Aggravating Factors   sitting still; initial walking    Pain Relieving Factors  stretching; gentle movement         [facility] PT Assessment - 02/15/20 0001      Assessment   Medical Diagnosis  M67.972 (ICD-10-CM) - Achilles tendon disorder, left    Referring Provider (PT)  Silverio Decamp, MD    Onset Date/Surgical Date  12/26/19    Next MD Visit  today       Observation/Other Assessments-Edema    Edema  --   increased edema Lt heel/achilles tendon area      Sensation   Additional Comments  burning and tightness       AROM   Left Ankle Dorsiflexion  7    Left Ankle Plantar Flexion  45      Strength   Left Ankle Dorsiflexion  5/5    Left Ankle Plantar Flexion  4-/5    Left Ankle Inversion  5/5    Left Ankle  Eversion  5/5      Palpation   Palpation comment  point tender lateral to achilles tendon with area of edema noted.                   [facility] Adult PT Treatment/Exercise - 02/15/20 0001      Knee/Hip Exercises: Aerobic   Recumbent Bike  L2x6'      Ultrasound   Ultrasound Location  Lt achilles    Ultrasound Parameters  7 min 1.0 w/cm2     Ultrasound Goals  Pain;Edema      Vasopneumatic   Number Minutes Vasopneumatic   10 minutes    Vasopnuematic Location   Ankle    Vasopneumatic Pressure  Low    Vasopneumatic Temperature   34 deg       Manual Therapy   Joint Mobilization  PRONE: Grade II-III lateral ankle mortisse mobilization, AP and PA grade II-III, and grade IV subtalar mobilization   STANDING:  PT used gait belt around tibia/fibular complex  and had patient step down with R LE from 3" step and stabilized ankle to get tibia movement along ankle mortisse    Soft tissue mobilization  deep tissue work Forensic scientist with IASTM     Myofascial Release  posterior Lt calf       Ankle Exercises: Stretches   Soleus Stretch  2 reps;30 seconds    Gastroc Stretch  2 reps;30 seconds    Gastroc Stretch Limitations  standing     Slant Board Stretch  2 reps;60 seconds    Other Stretch  DF stretch in sitting 20-30 sec x 3 reps     Other Stretch  hip flexor stretch seated 30 sec x 2 each side       Ankle Exercises: Standing   Vector Stance  Left;15 seconds   SLS on foam pad 20-30 sec x 3    SLS  Lt 15 - 20 sec x 2; Lt with movement of Rt LE 20-30 sec x 2 - UE support as needed for balance     Other Standing Ankle Exercises  SLS with forward leans - x 5 reps     Other Standing Ankle Exercises  lateral step down heel tap on the Rt to improve DF on the right; step down w/ Rt foot stepping down - 4 inch step x 15-20 reps                   PT Long Term Goals - 01/25/20 0939      PT LONG TERM GOAL #1   Title  The patient will be indep with HEP.    Status  On-going       PT LONG TERM GOAL #2   Title  The patient will reduce functional limitation per FOTO from 36% to < or equal to 28% to demonstrate improving functional mobility.    Status  On-going      PT LONG TERM GOAL #3   Title  The patient will report pain in the morning < or equal to 4/10.    Baseline  pt reports no pain when she first wakes up 6/10 when she gets out of bed and starts walking    Status  On-going      PT LONG TERM GOAL #4   Title  The patient will return demonstrate 5 heel raises in left single limb stance.    Baseline  unable to perform one single 8/10 pain    Status  On-going      PT LONG TERM GOAL #5   Title  The patient will report ability to negotiate steps in a reciprocal pattern without turning sideways to avoid L heel pain.    Baseline  able to do this however does have some pain    Status  Achieved            Plan - 02/15/20 0955    Clinical Impression Statement  Up and down with course of Lt ankle pain - with increasd pain reported with incresad standing and walking. She demonstrates good gains in ankle ROM and strength. Balance has improved, Patient does well with taping for ankle and calf.    Rehab Potential  Good    PT Frequency  2x / week    PT Duration  6 weeks    PT Treatment/Interventions  ADLs/Self Care Home Management;Gait training;Iontophoresis 4mg /ml Dexamethasone;Cryotherapy;Moist Heat;Manual techniques;Taping;Dry needling;Passive range of motion;Patient/family education;Functional mobility training;Stair training;Therapeutic activities;Therapeutic exercise;Neuromuscular re-education    PT Next Visit Plan  continue US/manual work/taping - exercise; progressing as indicated Note to MD    PT Home Exercise Plan  Access Code: [email or web address]    Consulted and Agree with Plan of Care  Patient       Patient will benefit from skilled therapeutic intervention in order to improve the following deficits and impairments:     Visit Diagnosis: Pain in left ankle  and joints of left foot  Muscle weakness (generalized)  Other abnormalities of gait and mobility     Problem List Patient Active Problem List   Diagnosis Date Noted  . Insertional Achilles tendinopathy, left 12/26/2019    Gina Morrison PT, MPH  02/15/2020, 10:21 AM  [facility] [address] [place], [place], [address] Phone: [phone]   Fax:  [phone]  Name: Gina Morrison MRN: [id number] Date of Birth: 1961/03/27

## 2020-02-20 ENCOUNTER — Ambulatory Visit: Payer: BC Managed Care – PPO | Admitting: Sports Medicine

## 2020-02-22 ENCOUNTER — Other Ambulatory Visit: Payer: Self-pay

## 2020-02-22 ENCOUNTER — Ambulatory Visit (INDEPENDENT_AMBULATORY_CARE_PROVIDER_SITE_OTHER): Payer: BC Managed Care – PPO | Admitting: Rehabilitative and Restorative Service Providers"

## 2020-02-22 ENCOUNTER — Encounter: Payer: Self-pay | Admitting: Rehabilitative and Restorative Service Providers"

## 2020-02-22 DIAGNOSIS — R2689 Other abnormalities of gait and mobility: Secondary | ICD-10-CM | POA: Diagnosis not present

## 2020-02-22 DIAGNOSIS — M25572 Pain in left ankle and joints of left foot: Secondary | ICD-10-CM | POA: Diagnosis not present

## 2020-02-22 DIAGNOSIS — M6281 Muscle weakness (generalized): Secondary | ICD-10-CM

## 2020-02-22 NOTE — Therapy (Signed)
[place], [place], [address] Phone: [phone]   Fax:  [phone]  Physical Therapy Treatment  Patient Details  Name: Gina Morrison MRN: [id number] Date of Birth: [birth year] Referring Provider (PT): Silverio Decamp, MD   Encounter Date: 02/22/2020  PT End of Session - 02/22/20 0948    Visit Number  9    Number of Visits  18    Date for PT Re-Evaluation  04/04/20    PT Start Time  5366    PT Stop Time  1041   moist heat end of treatment   PT Time Calculation (min)  54 min    Activity Tolerance  Patient tolerated treatment well       History reviewed. No pertinent past medical history.  History reviewed. No pertinent surgical history.  There were no vitals filed for this visit.  Subjective Assessment - 02/22/20 0949    Subjective  Still tight - can massage and stretch and she feels better but it just tightens up again. Walked about a mile and a half yesterday and then soaked foot and ankle in epsom salts and it felt good - stiff again this am.    Currently in Pain?  Yes    Pain Score  4     Pain Location  Ankle    Pain Orientation  Left    Pain Descriptors / Indicators  Tightness;Discomfort    Pain Type  Chronic pain    Pain Onset  More than a month ago    Pain Frequency  Intermittent         [facility] PT Assessment - 02/22/20 0001      Assessment   Medical Diagnosis  M67.972 (ICD-10-CM) - Achilles tendon disorder, left    Referring Provider (PT)  Silverio Decamp, MD    Onset Date/Surgical Date  12/26/19    Next MD Visit  to schedule       Observation/Other Assessments-Edema    Edema  --   increased edema Lt heel/achilles tendon area      Sensation   Additional Comments  burning and tightness       AROM   Left Ankle Dorsiflexion  7    Left Ankle Plantar Flexion  45      Strength   Left Ankle Dorsiflexion  5/5    Left Ankle Plantar Flexion  4-/5    Left Ankle  Inversion  5/5    Left Ankle Eversion  5/5      Palpation   Palpation comment  point tender lateral to achilles tendon with area of edema noted.; muscular tightness through the calf - medially/central/laterally into the achilles tendon insertioin                    [facility] Adult PT Treatment/Exercise - 02/22/20 0001      Knee/Hip Exercises: Aerobic   Recumbent Bike  L2x6'      Ultrasound   Ultrasound Location  Lt achilles    Ultrasound Parameters  7 min .8 w/cm2    Ultrasound Goals  Pain;Edema      Manual Therapy   Manual therapy comments  skilled palpation and montioring during DN    Joint Mobilization  PRONE: AP and PA grade II-III,    Soft tissue mobilization  deep tissue work Lt gastroc/soleous     Myofascial Release  posterior Lt calf     Insurance account manager;Inhibit Muscle  Kinesiotix   Create Space  I strip along the Lt achilles     Inhibit Muscle   X strip base of calcaneous across the achilles tendon to each side of the calf; one strip across calcaneous       Ankle Exercises: Stretches   Soleus Stretch  2 reps;30 seconds    Gastroc Stretch  2 reps;30 seconds    Gastroc Stretch Limitations  standing     Slant Board Stretch  2 reps;60 seconds    Other Stretch  hip flexor stretch seated 30 sec x 2 each side        Trigger Point Dry Needling - 02/22/20 0001    Consent Given?  Yes    Education Handout Provided  Previously provided    Dry Needling Comments  pt prone LE DN Lt only     Gastrocnemius Response  Palpable increased muscle length    Soleus Response  Palpable increased muscle length                PT Long Term Goals - 02/22/20 1050      PT LONG TERM GOAL #1   Title  The patient will be indep with HEP.    Time  10    Period  Weeks    Status  Revised    Target Date  04/04/20      PT LONG TERM GOAL #2   Title  The patient will reduce functional limitation per FOTO from 36% to < or equal to 28% to demonstrate improving  functional mobility.    Time  10    Period  Weeks    Status  Revised    Target Date  04/04/20      PT LONG TERM GOAL #3   Title  The patient will report pain in the morning < or equal to 4/10.    Baseline  -    Time  10    Status  Revised    Target Date  04/04/20      PT LONG TERM GOAL #4   Title  The patient will return demonstrate 5 heel raises in left single limb stance.    Baseline  -    Time  6    Period  Weeks    Status  Revised    Target Date  04/04/20      PT LONG TERM GOAL #5   Title  The patient will report ability to negotiate steps in a reciprocal pattern without turning sideways to avoid L heel pain.    Baseline  able to do this however does have some pain    Time  6    Period  Weeks    Status  Achieved            Plan - 02/22/20 1052    Clinical Impression Statement  Good day yesterday - walked 1.5 miles with her daughter and soaked foot afterward. Foot and ankle felt good. Stiff again this am. Patient continued with stretching and strengthening. Repeated trial of DN and manual work through the calf. Trial of new taping techniques for Achilles tendonitis. Patient to schedule for injection - voices some concern about her body's response to injection.    Rehab Potential  Good    PT Frequency  2x / week    PT Duration  6 weeks    PT Treatment/Interventions  ADLs/Self Care Home Management;Gait training;Iontophoresis 4mg /ml Dexamethasone;Cryotherapy;Moist Heat;Manual techniques;Taping;Dry needling;Passive range of motion;Patient/family education;Functional mobility training;Stair training;Therapeutic  activities;Therapeutic exercise;Neuromuscular re-education    PT Next Visit Plan  continue US/manual work/taping - exercise; progressing as indicated and scheduled pending injection and immobilization following injection    PT Home Exercise Plan  Access Code: [email or web address]    Consulted and Agree with Plan of Care  Patient       Patient will benefit from skilled  therapeutic intervention in order to improve the following deficits and impairments:     Visit Diagnosis: Pain in left ankle and joints of left foot - Plan: PT plan of care cert/re-cert  Muscle weakness (generalized) - Plan: PT plan of care cert/re-cert  Other abnormalities of gait and mobility - Plan: PT plan of care cert/re-cert     Problem List Patient Active Problem List   Diagnosis Date Noted  . Insertional Achilles tendinopathy, left 12/26/2019    Avereigh Spainhower Rober Minion PT, MPH  02/22/2020, 10:58 AM  [facility] [address] [place], [place], [address] Phone: [phone]   Fax:  [phone]  Name: Sabah Zucco Henry MRN: [id number] Date of Birth: Nov 24, 1960

## 2020-02-29 ENCOUNTER — Other Ambulatory Visit: Payer: Self-pay

## 2020-02-29 ENCOUNTER — Encounter: Payer: Self-pay | Admitting: Rehabilitative and Restorative Service Providers"

## 2020-02-29 ENCOUNTER — Ambulatory Visit: Payer: BC Managed Care – PPO | Admitting: Sports Medicine

## 2020-02-29 ENCOUNTER — Ambulatory Visit (INDEPENDENT_AMBULATORY_CARE_PROVIDER_SITE_OTHER): Payer: BC Managed Care – PPO | Admitting: Rehabilitative and Restorative Service Providers"

## 2020-02-29 DIAGNOSIS — R2689 Other abnormalities of gait and mobility: Secondary | ICD-10-CM | POA: Diagnosis not present

## 2020-02-29 DIAGNOSIS — M25572 Pain in left ankle and joints of left foot: Secondary | ICD-10-CM | POA: Diagnosis not present

## 2020-02-29 DIAGNOSIS — M6281 Muscle weakness (generalized): Secondary | ICD-10-CM

## 2020-02-29 NOTE — Therapy (Signed)
[place], [place], [address] Phone: [phone]   Fax:  [phone]  Physical Therapy Treatment  Patient Details  Name: Gina Morrison MRN: [id number] Date of Birth: [birth year] Referring Provider (PT): Silverio Decamp, MD   Encounter Date: 02/29/2020  PT End of Session - 02/29/20 1204    Visit Number  10    Number of Visits  18    Date for PT Re-Evaluation  04/04/20    PT Start Time  1203   pt 18 min late for appt   PT Stop Time  1235    PT Time Calculation (min)  32 min    Activity Tolerance  Patient tolerated treatment well       History reviewed. No pertinent past medical history.  History reviewed. No pertinent surgical history.  There were no vitals filed for this visit.  Subjective Assessment - 02/29/20 1209    Subjective  Opted to wait on the injection. Has stretched more consistently and taken it a bit easier and her heel feels better. Less pain. less morning stiffness this morning.    Currently in Pain?  Yes    Pain Score  2     Pain Location  Ankle    Pain Orientation  Left    Pain Descriptors / Indicators  Aching;Tightness    Pain Type  Chronic pain         [facility] PT Assessment - 02/29/20 0001      Assessment   Medical Diagnosis  M67.972 (ICD-10-CM) - Achilles tendon disorder, left    Referring Provider (PT)  Silverio Decamp, MD    Onset Date/Surgical Date  12/26/19    Next MD Visit  to schedule       Sensation   Additional Comments  decreased burning and tightness       AROM   Left Ankle Dorsiflexion  7    Left Ankle Plantar Flexion  45      Strength   Left Ankle Dorsiflexion  5/5    Left Ankle Plantar Flexion  4-/5    Left Ankle Inversion  5/5    Left Ankle Eversion  5/5      Palpation   Palpation comment  point tender lateral to achilles tendon with area of edema noted.; muscular tightness through the calf - medially/central/laterally into the  achilles tendon insertioin                    [facility] Adult PT Treatment/Exercise - 02/29/20 0001      Knee/Hip Exercises: Aerobic   Recumbent Bike  L2x6'      Ultrasound   Ultrasound Location  Lt achilles tendon area    Ultrasound Parameters  7 min .8 w/cm2; 3.3 mHz    Ultrasound Goals  Pain;Edema      Manual Therapy   Soft tissue mobilization  deep tissue work Lt gastroc/soleous     Myofascial Release  posterior Lt calf     Insurance account manager;Inhibit Muscle      Kinesiotix   Create Space  I strip along the Lt achilles     Inhibit Muscle   X strip base of calcaneous across the achilles tendon to each side of the calf; one strip across calcaneous       Ankle Exercises: Stretches   Soleus Stretch  2 reps;30 seconds    Gastroc Stretch  2 reps;30 seconds    Gastroc Stretch Limitations  standing     Other Stretch  hip flexor stretch seated 30 sec x 2 each side                   PT Long Term Goals - 02/22/20 1050      PT LONG TERM GOAL #1   Title  The patient will be indep with HEP.    Time  10    Period  Weeks    Status  Revised    Target Date  04/04/20      PT LONG TERM GOAL #2   Title  The patient will reduce functional limitation per FOTO from 36% to < or equal to 28% to demonstrate improving functional mobility.    Time  10    Period  Weeks    Status  Revised    Target Date  04/04/20      PT LONG TERM GOAL #3   Title  The patient will report pain in the morning < or equal to 4/10.    Baseline  -    Time  10    Status  Revised    Target Date  04/04/20      PT LONG TERM GOAL #4   Title  The patient will return demonstrate 5 heel raises in left single limb stance.    Baseline  -    Time  6    Period  Weeks    Status  Revised    Target Date  04/04/20      PT LONG TERM GOAL #5   Title  The patient will report ability to negotiate steps in a reciprocal pattern without turning sideways to avoid L heel pain.    Baseline  able to do  this however does have some pain    Time  6    Period  Weeks    Status  Achieved            Plan - 02/29/20 1241    Clinical Impression Statement  Patient reports inprovement in the past week. She has been more consistent with her HEP and wearing supportive shoes. She has alwo avoided prolonged periods of time on her feet as much as possible. Patient has persistent muscular tightness through the Lt posterior calf and achille's tendon area - pain is noted through Lt heel/calcaneous area. Response well to [place], manual work and taping. Discussed patient bringing a family member to learn how to tape her ankle. Will decrease to 1x/wk and continue week to [facility] to assess improvement and readiness to progress wiht eccentric exercises. Discussed consultation with orthotist for foot wear suggestions.       Patient will benefit from skilled therapeutic intervention in order to improve the following deficits and impairments:     Visit Diagnosis: Pain in left ankle and joints of left foot  Muscle weakness (generalized)  Other abnormalities of gait and mobility     Problem List Patient Active Problem List   Diagnosis Date Noted  . Insertional Achilles tendinopathy, left 12/26/2019    Shanira Tine Rober Minion PT, MPH  02/29/2020, 12:48 PM  [facility] [address] [place], [place], [address] Phone: [phone]   Fax:  [phone]  Name: Nyjah Schwake Roupp MRN: [id number] Date of Birth: August 15, 1961

## 2020-03-07 ENCOUNTER — Encounter: Payer: BC Managed Care – PPO | Admitting: Rehabilitative and Restorative Service Providers"

## 2020-03-14 ENCOUNTER — Ambulatory Visit (INDEPENDENT_AMBULATORY_CARE_PROVIDER_SITE_OTHER): Payer: BC Managed Care – PPO | Admitting: Rehabilitative and Restorative Service Providers"

## 2020-03-14 ENCOUNTER — Encounter: Payer: Self-pay | Admitting: Rehabilitative and Restorative Service Providers"

## 2020-03-14 DIAGNOSIS — M25572 Pain in left ankle and joints of left foot: Secondary | ICD-10-CM | POA: Diagnosis not present

## 2020-03-14 DIAGNOSIS — M6281 Muscle weakness (generalized): Secondary | ICD-10-CM

## 2020-03-14 DIAGNOSIS — R2689 Other abnormalities of gait and mobility: Secondary | ICD-10-CM

## 2020-03-14 NOTE — Therapy (Signed)
[place], [place], [address] Phone: [phone]   Fax:  [phone]  Physical Therapy Treatment  Patient Details  Name: Gina Morrison MRN: [id number] Date of Birth: 11-16-1961 Referring Provider (PT): Silverio Decamp, MD   Encounter Date: 03/14/2020  PT End of Session - 03/14/20 1026    Visit Number  11    Number of Visits  18    Date for PT Re-Evaluation  04/04/20    PT Start Time  1022    PT Stop Time  1100   [place] end of treatment   PT Time Calculation (min)  38 min    Activity Tolerance  Patient tolerated treatment well       History reviewed. No pertinent past medical history.  History reviewed. No pertinent surgical history.  There were no vitals filed for this visit.  Subjective Assessment - 03/14/20 1026    Subjective  Patient reports that she has been using magnesium oil and using ice every day. She is continuing with her stretching and strengthening at home. Taping really helps but she has not been able to do the taping at home    Currently in Pain?  Yes    Pain Score  2     Pain Location  Ankle    Pain Orientation  Left    Pain Descriptors / Indicators  Tightness;Burning    Pain Type  Chronic pain    Pain Onset  More than a month ago    Pain Frequency  Intermittent         [facility] PT Assessment - 03/14/20 0001      Assessment   Medical Diagnosis  M67.972 (ICD-10-CM) - Achilles tendon disorder, left    Referring Provider (PT)  Silverio Decamp, MD    Onset Date/Surgical Date  12/26/19    Next MD Visit  PRN       Sensation   Additional Comments  decreased burning and tightness       AROM   Left Ankle Dorsiflexion  10    Left Ankle Plantar Flexion  51      Strength   Right Hip Flexion  5/5    Left Hip Flexion  5/5    Left Ankle Dorsiflexion  5/5    Left Ankle Plantar Flexion  4/5    Left Ankle Inversion  5/5    Left Ankle Eversion  5/5      Palpation    Palpation comment  decreased point tender lateral to achilles tendon with area of less edema noted.; muscular tightness through the calf - medially/central/laterally into the achilles tendon insertion                   [facility] Adult PT Treatment/Exercise - 03/14/20 0001      Knee/Hip Exercises: Aerobic   Recumbent Bike  L2x6'      Ultrasound   Ultrasound Location  Lt achilles tendon and lateral cancaneous area     Ultrasound Parameters  7 min .8 w/cm@; 1 mHz; 100%     Ultrasound Goals  Pain;Edema      Manual Therapy   Soft tissue mobilization  deep tissue work Lt gastroc/soleous     Myofascial Release  posterior Lt calf     Insurance account manager;Inhibit Muscle      Kinesiotix   Create Space  I strip along the Lt achilles     Inhibit Muscle   X strip  base of calcaneous across the achilles tendon to each side of the calf; one strip across calcaneous       Ankle Exercises: Stretches   Soleus Stretch  2 reps;30 seconds    Gastroc Stretch  2 reps;30 seconds    Gastroc Stretch Limitations  standing     Other Stretch  hip flexor stretch seated 30 sec x 2 each side       Ankle Exercises: Standing   SLS  Lt 15 - 20 sec x 2; Lt with movement of Rt LE 20-30 sec x 2 - UE support as needed for balance     Heel Raises  Both;10 reps;Left;5 reps;1 second    Other Standing Ankle Exercises  SLS with forward leans - x 5 reps                   PT Long Term Goals - 02/22/20 1050      PT LONG TERM GOAL #1   Title  The patient will be indep with HEP.    Time  10    Period  Weeks    Status  Revised    Target Date  04/04/20      PT LONG TERM GOAL #2   Title  The patient will reduce functional limitation per FOTO from 36% to < or equal to 28% to demonstrate improving functional mobility.    Time  10    Period  Weeks    Status  Revised    Target Date  04/04/20      PT LONG TERM GOAL #3   Title  The patient will report pain in the morning < or equal to 4/10.     Baseline  -    Time  10    Status  Revised    Target Date  04/04/20      PT LONG TERM GOAL #4   Title  The patient will return demonstrate 5 heel raises in left single limb stance.    Baseline  -    Time  6    Period  Weeks    Status  Revised    Target Date  04/04/20      PT LONG TERM GOAL #5   Title  The patient will report ability to negotiate steps in a reciprocal pattern without turning sideways to avoid L heel pain.    Baseline  able to do this however does have some pain    Time  6    Period  Weeks    Status  Achieved            Plan - 03/14/20 1251    Clinical Impression Statement  Decreased pain per patient report. Demonstrates increased ankle ROM and LE strength with decreased tenderness to palpation through the lateral calcaneous in area of original pain. Progressing gradually toward stated goals of treatment.    Rehab Potential  Good    PT Frequency  1x / week    PT Duration  6 weeks    PT Treatment/Interventions  ADLs/Self Care Home Management;Gait training;Iontophoresis 4mg /ml Dexamethasone;Cryotherapy;Moist Heat;Manual techniques;Taping;Dry needling;Passive range of motion;Patient/family education;Functional mobility training;Stair training;Therapeutic activities;Therapeutic exercise;Neuromuscular re-education    PT Next Visit Plan  continue US/manual work/taping - exercise; progressing as indicated    PT Home Exercise Plan  Access Code: [email or web address]    Consulted and Agree with Plan of Care  Patient       Patient will benefit from skilled therapeutic intervention in order to  improve the following deficits and impairments:     Visit Diagnosis: Pain in left ankle and joints of left foot  Muscle weakness (generalized)  Other abnormalities of gait and mobility     Problem List Patient Active Problem List   Diagnosis Date Noted  . Insertional Achilles tendinopathy, left 12/26/2019    Shandora Koogler Rober Minion PT, MPH  03/14/2020, 12:52 PM  [facility] [address] [place], [place], [address] Phone: [phone]   Fax:  [phone]  Name: Gina Morrison MRN: [id number] Date of Birth: Jul 31, 1961

## 2020-03-19 ENCOUNTER — Encounter: Payer: Self-pay | Admitting: Physical Therapy

## 2020-03-19 ENCOUNTER — Ambulatory Visit (INDEPENDENT_AMBULATORY_CARE_PROVIDER_SITE_OTHER): Payer: BC Managed Care – PPO | Admitting: Physical Therapy

## 2020-03-19 ENCOUNTER — Other Ambulatory Visit: Payer: Self-pay

## 2020-03-19 DIAGNOSIS — M6281 Muscle weakness (generalized): Secondary | ICD-10-CM | POA: Diagnosis not present

## 2020-03-19 DIAGNOSIS — R2689 Other abnormalities of gait and mobility: Secondary | ICD-10-CM

## 2020-03-19 DIAGNOSIS — M25572 Pain in left ankle and joints of left foot: Secondary | ICD-10-CM

## 2020-03-19 NOTE — Therapy (Signed)
[place], [place], [address] Phone: [phone]   Fax:  [phone]  Physical Therapy Treatment  Patient Details  Name: Gina Morrison Silverthorn MRN: [id number] Date of Birth: Jul 20, 1961 Referring Provider (PT): Silverio Decamp, MD   Encounter Date: 03/19/2020  PT End of Session - 03/19/20 1022    Visit Number  12    Number of Visits  18    Date for PT Re-Evaluation  04/04/20    PT Start Time  7169    PT Stop Time  6789    PT Time Calculation (min)  44 min       History reviewed. No pertinent past medical history.  History reviewed. No pertinent surgical history.  There were no vitals filed for this visit.  Subjective Assessment - 03/19/20 1024    Subjective  Pt states that she is "frustrated" with L ankle for slowing her down; she wishes to fully get back to her active lifestyle. Walking over the weekend created Lt ankle discomfort with sit to stand. She reports trying to keep Lt ankle moving to prevent tightness from forming. Stairs are still causing her some Lt ankle pain. Taping helps but hinders her from using ointments for self massage per pt statement. Lt ankle pain up to a 6/10 with single leg raises.    Currently in Pain?  No/denies    Pain Score  0-No pain    Pain Location  Ankle    Pain Orientation  Left;Posterior    Pain Descriptors / Indicators  Discomfort;Tightness    Pain Onset  More than a month ago    Aggravating Factors   stairs, up and down    Pain Relieving Factors  taping, stretching          [facility] Adult PT Treatment/Exercise - 03/19/20 0001      Knee/Hip Exercises: Aerobic   Recumbent Bike  L2x 4.5 min      Knee/Hip Exercises: Standing   Stairs  4 in step x 20, cues for form and pace; 6 in x 5 trial, increase Lt ankle discomfort   cues for Rt knee alignment and Lt foot strike     Ultrasound   Ultrasound Location  Lt achilles tendon and calcaneous    Ultrasound  Parameters  8 min;1 w/cm2; 3.3 mHz/ 50%    Ultrasound Goals  Pain;Edema      Manual Therapy   Kinesiotex  Create Space;Inhibit Muscle   X Lt achilles, lat strip achilles, long strip PF to  Lt calf     Ankle Exercises: Standing   SLS  Lt 30 sec   no pain   Heel Raises  Right;Left;Both;5 reps;10 reps   SL x 5 reps Rt, Lt 4 reps-discontinued, bilat x 10 reps   Warrior III  x10 Lt   tactile cues for form, no pain     Ankle Exercises: Stretches   Soleus Stretch  2 reps;30 seconds   standing at wall   Gastroc Stretch  2 reps;30 seconds    Gastroc Stretch Limitations  standing    runners, toes on wall       PT Long Term Goals - 03/19/20 1127      PT LONG TERM GOAL #1   Title  The patient will be indep with HEP.    Time  10    Period  Weeks    Status  On-going      PT LONG TERM GOAL #2  Title  The patient will reduce functional limitation per FOTO from 36% to < or equal to 28% to demonstrate improving functional mobility.    Time  10    Period  Weeks    Status  On-going      PT LONG TERM GOAL #3   Title  The patient will report pain in the morning < or equal to 4/10.    Baseline  2/10 pain, taken 03/19/20    Time  10    Status  Achieved      PT LONG TERM GOAL #4   Title  The patient will return demonstrate 5 heel raises in left single limb stance.    Baseline  unable to complete more than 4.- 03/19/20    Time  6    Period  Weeks    Status  On-going      PT LONG TERM GOAL #5   Title  The patient will report ability to negotiate steps in a reciprocal pattern without turning sideways to avoid L heel pain.    Baseline  able to do this however with pain    Time  6    Period  Weeks    Status  Achieved            Plan - 03/19/20 1129    Clinical Impression Statement  Stiffness and discomfort are a continuing point of frustration for her. Pt had questions about pursuing injections per doctor recommendation within POC; she was advised to discuss options and concerns  with her doctor. LTG 3 has been met; less than 4/10 Lt achilles pain in morning. She is progressing gradually towards all other goals.    Examination-Activity Limitations  Stairs;Locomotion Level    Examination-Participation Restrictions  Community Activity    Stability/Clinical Decision Making  Stable/Uncomplicated    Rehab Potential  Good    PT Frequency  1x / week    PT Duration  6 weeks    PT Treatment/Interventions  ADLs/Self Care Home Management;Gait training;Iontophoresis 9m/ml Dexamethasone;Cryotherapy;Moist Heat;Manual techniques;Taping;Dry needling;Passive range of motion;Patient/family education;Functional mobility training;Stair training;Therapeutic activities;Therapeutic exercise;Neuromuscular re-education    PT Next Visit Plan  continue US/manual work/taping - exercise; progressing as indicated, massage to Lt achilles and plantar surface Lt foot, ice massage- if indicated    PT Home Exercise Plan  Access Code: [email or web address]       Patient will benefit from skilled therapeutic intervention in order to improve the following deficits and impairments:  Decreased range of motion, Decreased strength, Impaired flexibility, Pain, Difficulty walking, Increased fascial restricitons, Decreased activity tolerance, Hypomobility  Visit Diagnosis: Pain in left ankle and joints of left foot  Muscle weakness (generalized)  Other abnormalities of gait and mobility     Problem List Patient Active Problem List   Diagnosis Date Noted  . Insertional Achilles tendinopathy, left 12/26/2019    JRonaldo Miyamoto SPTA 03/19/20 1:58 PM  This entire session was performed under direct supervision and direction of a licensed Physical TBrewing technologist I have personally read, edited and approved of the note as written.  JKerin Perna PTA 03/19/20 1:58 PM   [facility] [address] [address]Phone: [phone]   Fax:  [phone] Name: SMarra FragaWard MRN: [id number] of Birth: [birth year]

## 2020-03-28 ENCOUNTER — Ambulatory Visit (INDEPENDENT_AMBULATORY_CARE_PROVIDER_SITE_OTHER): Payer: BC Managed Care – PPO | Admitting: Physical Therapy

## 2020-03-28 ENCOUNTER — Other Ambulatory Visit: Payer: Self-pay

## 2020-03-28 ENCOUNTER — Encounter: Payer: Self-pay | Admitting: Physical Therapy

## 2020-03-28 DIAGNOSIS — R2689 Other abnormalities of gait and mobility: Secondary | ICD-10-CM | POA: Diagnosis not present

## 2020-03-28 DIAGNOSIS — M6281 Muscle weakness (generalized): Secondary | ICD-10-CM | POA: Diagnosis not present

## 2020-03-28 DIAGNOSIS — M25572 Pain in left ankle and joints of left foot: Secondary | ICD-10-CM | POA: Diagnosis not present

## 2020-03-28 NOTE — Therapy (Signed)
[place], [place], [address] Phone: [phone]   Fax:  [phone]  Physical Therapy Treatment  Patient Details  Name: Gina Morrison MRN: [id number] Date of Birth: [birth year] Referring Provider (PT): Silverio Decamp, MD   Encounter Date: 03/28/2020  PT End of Session - 03/28/20 0941    Visit Number  13    Number of Visits  18    Date for PT Re-Evaluation  04/04/20    PT Start Time  0942   pt arrived late; traffic.   PT Stop Time  1017    PT Time Calculation (min)  35 min    Behavior During Therapy  [facility] for tasks assessed/performed       History reviewed. No pertinent past medical history.  History reviewed. No pertinent surgical history.  There were no vitals filed for this visit.  Subjective Assessment - 03/28/20 0943    Subjective  Pt complains of persistant tightness in Lt heel.  At rest no pain, but agrivating/ burning pain when up and moving.  She voices ready to return to MD for further evaluation.    Patient Stated Goals  Reduce pain in the L ankle "I want to be able to run if I need to".  She notes tired of alternating how she does things in life.    Currently in Pain?  Yes    Pain Score  3     Pain Location  Heel    Pain Orientation  Left;Posterior    Pain Descriptors / Indicators  Nagging;Tightness    Aggravating Factors   walking, stairs    Pain Relieving Factors  taping, ice         [facility] PT Assessment - 03/28/20 0001      Assessment   Medical Diagnosis  M67.972 (ICD-10-CM) - Achilles tendon disorder, left    Referring Provider (PT)  Silverio Decamp, MD    Onset Date/Surgical Date  12/26/19    Next MD Visit  PRN       Observation/Other Assessments   Focus on Therapeutic Outcomes (FOTO)   41% limited (goal 28% limited)      Palpation   Palpation comment  tightness/ tenderness along post tib and distal soleus;  point tender lateral Lt calcaneus at  Achilles insertion        [facility] Adult PT Treatment/Exercise - 03/28/20 0001      Knee/Hip Exercises: Stretches   Passive Hamstring Stretch  Left;3 reps;30 seconds   supine with strap   ITB Stretch  Left;2 reps;30 seconds    Other Knee/Hip Stretches  Lt adductor stretch in supine with strap x 30 sec x 3     Other Knee/Hip Stretches  Pt shown soleus/plantar fascia stretch for HEP; pt verbalized understanding.       Iontophoresis   Location  Lt calcaneus at Achilles insertion     Dose  1.0 cc     Time  4 hr - 80 mA stat patch       Manual Therapy   Soft tissue mobilization  STM to Lt soleus/gastroc/ post tib to decrease fascial restrictions      Ankle Exercises: Stretches   Other Stretch  childs pose (modified resting on elbows for knee tolerance) x 60 sec, x 2 reps with cat/cow toes tucked x 1 rep                  PT Long Term Goals - 03/19/20  1127      PT LONG TERM GOAL #1   Title  The patient will be indep with HEP.    Time  10    Period  Weeks    Status  On-going      PT LONG TERM GOAL #2   Title  The patient will reduce functional limitation per FOTO from 36% to < or equal to 28% to demonstrate improving functional mobility.    Time  10    Period  Weeks    Status  On-going      PT LONG TERM GOAL #3   Title  The patient will report pain in the morning < or equal to 4/10.    Baseline  2/10 pain, taken 03/19/20    Time  10    Status  Achieved      PT LONG TERM GOAL #4   Title  The patient will return demonstrate 5 heel raises in left single limb stance.    Baseline  unable to complete more than 4.- 03/19/20    Time  6    Period  Weeks    Status  On-going      PT LONG TERM GOAL #5   Title  The patient will report ability to negotiate steps in a reciprocal pattern without turning sideways to avoid L heel pain.    Baseline  able to do this however with pain    Time  6    Period  Weeks    Status  Achieved            Plan - 03/28/20 1141    Clinical  Impression Statement  Continued persistant tightness and pain in Lt Achilles tendon insertion with weightbearing exercises.   Time spent reviewing HEP and activities to avoid.  Another trial of ionto applied today (this time a 4 hr patch vs 12 hr).  Spoke to supervising PT; recommended pt return to MD for further evaluation.  Gradual but limited progress towards goals.    Examination-Activity Limitations  Stairs;Locomotion Level    Examination-Participation Restrictions  Community Activity    Stability/Clinical Decision Making  Stable/Uncomplicated    Rehab Potential  Good    PT Frequency  1x / week    PT Duration  6 weeks    PT Treatment/Interventions  ADLs/Self Care Home Management;Gait training;Iontophoresis 4mg /ml Dexamethasone;Cryotherapy;Moist Heat;Manual techniques;Taping;Dry needling;Passive range of motion;Patient/family education;Functional mobility training;Stair training;Therapeutic activities;Therapeutic exercise;Neuromuscular re-education    PT Next Visit Plan  hold therapy until pt returns to MD.    PT Home Exercise Plan  Access Code: [email or web address]       Patient will benefit from skilled therapeutic intervention in order to improve the following deficits and impairments:  Decreased range of motion, Decreased strength, Impaired flexibility, Pain, Difficulty walking, Increased fascial restricitons, Decreased activity tolerance, Hypomobility  Visit Diagnosis: Pain in left ankle and joints of left foot  Muscle weakness (generalized)  Other abnormalities of gait and mobility     Problem List Patient Active Problem List   Diagnosis Date Noted  . Insertional Achilles tendinopathy, left 12/26/2019   02/23/2020, PTA 03/28/20 11:45 AM  [facility] [place] [address] [place], [place], [place] Phone: [phone]   Fax:  [phone]  Name: Gina Morrison MRN: Emilio Math Date of Birth: 07-09-1961

## 2020-04-04 ENCOUNTER — Other Ambulatory Visit: Payer: Self-pay

## 2020-04-04 ENCOUNTER — Encounter: Payer: Self-pay | Admitting: Rehabilitative and Restorative Service Providers"

## 2020-04-04 ENCOUNTER — Ambulatory Visit (INDEPENDENT_AMBULATORY_CARE_PROVIDER_SITE_OTHER): Payer: BC Managed Care – PPO | Admitting: Rehabilitative and Restorative Service Providers"

## 2020-04-04 DIAGNOSIS — M6281 Muscle weakness (generalized): Secondary | ICD-10-CM | POA: Diagnosis not present

## 2020-04-04 DIAGNOSIS — R2689 Other abnormalities of gait and mobility: Secondary | ICD-10-CM

## 2020-04-04 DIAGNOSIS — M25572 Pain in left ankle and joints of left foot: Secondary | ICD-10-CM | POA: Diagnosis not present

## 2020-04-04 NOTE — Therapy (Addendum)
[place], [place], [address] Phone: [phone]   Fax:  [phone]  Physical Therapy Treatment and Discharge Summary  Patient Details  Name: Gina Morrison MRN: [id number] Date of Birth: February 04, 1961 Referring Provider (PT): Silverio Decamp, MD   Encounter Date: 04/04/2020  PT End of Session - 04/04/20 1250    Visit Number  14    Number of Visits  18    Date for PT Re-Evaluation  04/04/20    PT Start Time  0933    PT Stop Time  1015    PT Time Calculation (min)  42 min    Activity Tolerance  Patient tolerated treatment well    Behavior During Therapy  [facility] for tasks assessed/performed       History reviewed. No pertinent past medical history.  History reviewed. No pertinent surgical history.  There were no vitals filed for this visit.  Subjective Assessment - 04/04/20 0939    Subjective  Status of the L ankle depends on the day.  She reports pain is worse in the morning.  She tolerated participating in zumba yesterday evening.                        [place] Adult PT Treatment/Exercise - 04/04/20 1254      Exercises   Exercises  Ankle      Knee/Hip Exercises: Aerobic   Nustep  L5 x 5 minutes for warm up      Ultrasound   Ultrasound Location  left achilles    Ultrasound Parameters  8 min; 1 w/cm2, 3.3 mHz    Ultrasound Goals  Pain   patient notes relief from ultrasound at prior sessions     Manual Therapy   Manual Therapy  Taping;Joint mobilization;Soft tissue mobilization    Manual therapy comments  to improve mobility    Joint Mobilization  Prone:  grade II lateral ankle mortisse mobilization lateral and inversion/eversion, anterior drawer mobilization grade II     Soft tissue mobilization  STM to soleous, gastocnemius    Kinesiotex  Facilitate Muscle      Ankle Exercises: Stretches   Plantar Fascia Stretch  60 seconds    Gastroc Stretch  2 reps    Gastroc  Stretch Limitations  with passive overpressure in prone      Ankle Exercises: Standing   SLS  left x 30 seconds x 3 repetitions; worked on T forward lean for ankle atability    Heel Raises  Left;5 reps    Heel Raises Limitations  Needs intermittent UE support to perform;    Toe Walk (Round Trip)  is unable to toe walk             PT Education - 04/04/20 0940    Education Details  forward T single leg stance HEP,  calf roll on foam, soleous stretch, plantar flexor/gastroc stretch, TFL, hip flexor stretch, ankle alphabet    Person(s) Educated  Patient    Methods  Explanation;Demonstration    Comprehension  Verbalized understanding          PT Long Term Goals - 03/19/20 1127      PT LONG TERM GOAL #1   Title  The patient will be indep with HEP.    Time  10    Period  Weeks    Status  On-going      PT LONG TERM GOAL #2   Title  The  patient will reduce functional limitation per FOTO from 36% to < or equal to 28% to demonstrate improving functional mobility.    Time  10    Period  Weeks    Status  On-going      PT LONG TERM GOAL #3   Title  The patient will report pain in the morning < or equal to 4/10.    Baseline  2/10 pain, taken 03/19/20    Time  10    Status  Achieved      PT LONG TERM GOAL #4   Title  The patient will return demonstrate 5 heel raises in left single limb stance.    Baseline  unable to complete more than 4.- 03/19/20    Time  6    Period  Weeks    Status  On-going      PT LONG TERM GOAL #5   Title  The patient will report ability to negotiate steps in a reciprocal pattern without turning sideways to avoid L heel pain.    Baseline  able to do this however with pain    Time  6    Period  Weeks    Status  Achieved            Plan - 04/04/20 1300    Clinical Impression Statement  The patient continues to report intermittent pain in the L achilles.  She has had dry needling, stretching, manual techniques, ultrasound, and strengthening.  She  tried iontophoresis, however does not tolerate it well (she is concerned about systemic effects of meds).  PT recommended patient return to MD for further assessment.    Examination-Activity Limitations  Stairs;Locomotion Level    Examination-Participation Restrictions  Community Activity    Stability/Clinical Decision Making  Stable/Uncomplicated    Rehab Potential  Good    PT Frequency  1x / week    PT Duration  6 weeks    PT Treatment/Interventions  ADLs/Self Care Home Management;Gait training;Iontophoresis 72m/ml Dexamethasone;Cryotherapy;Moist Heat;Manual techniques;Taping;Dry needling;Passive range of motion;Patient/family education;Functional mobility training;Stair training;Therapeutic activities;Therapeutic exercise;Neuromuscular re-education    PT Next Visit Plan  hold therapy until pt returns to MD.    PT Home Exercise Plan  Access Code: [email or web address]       Patient will benefit from skilled therapeutic intervention in order to improve the following deficits and impairments:  Decreased range of motion, Decreased strength, Impaired flexibility, Pain, Difficulty walking, Increased fascial restricitons, Decreased activity tolerance, Hypomobility  Visit Diagnosis: Pain in left ankle and joints of left foot  Muscle weakness (generalized)  Other abnormalities of gait and mobility    PHYSICAL THERAPY DISCHARGE SUMMARY  Visits from Start of Care: 14  Current functional level related to goals / functional outcomes: See goals above, partially met   Remaining deficits: Patient on hold in order to return to MD due to continued pain.   Education / Equipment: Home program.   Plan: Patient agrees to discharge.  Patient goals were partially met. Patient is being discharged due to lack of progress.  ?????         Thank you for the referral of this patient. CRudell Cobb MPT  WWaverly PBryan5/19/2021, 1:01 PM  [facility] [place] [address]Phone: [phone]  Fax:  [phone] Name: Gina Morrison MRN: [id number] of Birth: 1Mar 24, 1962

## 2020-04-10 ENCOUNTER — Other Ambulatory Visit: Payer: Self-pay

## 2020-04-10 ENCOUNTER — Ambulatory Visit (INDEPENDENT_AMBULATORY_CARE_PROVIDER_SITE_OTHER): Payer: BC Managed Care – PPO | Admitting: Sports Medicine

## 2020-04-10 DIAGNOSIS — M67972 Unspecified disorder of synovium and tendon, left ankle and foot: Secondary | ICD-10-CM

## 2020-04-10 NOTE — Assessment & Plan Note (Signed)
Gina Morrison returns, she is a pleasant 59 year old female with chronic left heel pain. Clinically resembling insertional Achilles tendinosis and retrocalcaneal bursitis. She has done several months of conservative treatment including formal physical therapy, topical nitroglycerin, heel lifts, immobilization. It has now been almost a year of pain, today we injected her retrocalcaneal bursa with care taken to avoid intra-Achilles injection, she had a profoundly distended retrocalcaneal bursitis on ultrasound. She was pain-free after the injection, she will wear a cam boot for a solid week, continue heel lifts, and return to see me in 1 month.

## 2020-04-10 NOTE — Progress Notes (Signed)
    Procedures performed today:    Procedure: Real-time Ultrasound Guided injection of the left retrocalcaneal bursa Device: Samsung HS60  Verbal informed consent obtained.  Time-out conducted.  Noted no overlying erythema, induration, or other signs of local infection.  Skin prepped in a sterile fashion.  Local anesthesia: Topical Ethyl chloride.  With sterile technique and under real time ultrasound guidance: 1 cc Kenalog 40, 1 cc lidocaine, 1 cc bupivacaine injected easily Completed without difficulty  Pain immediately resolved suggesting accurate placement of the medication.  Advised to call if fevers/chills, erythema, induration, drainage, or persistent bleeding.  Images permanently stored and available for review in the ultrasound unit.  Impression: Technically successful ultrasound guided injection.  Independent interpretation of notes and tests performed by another provider:   None.  Brief History, Exam, Impression, and Recommendations:    Insertional Achilles tendinopathy, left Gina Morrison returns, she is a pleasant 59 year old female with chronic left heel pain. Clinically resembling insertional Achilles tendinosis and retrocalcaneal bursitis. She has done several months of conservative treatment including formal physical therapy, topical nitroglycerin, heel lifts, immobilization. It has now been almost a year of pain, today we injected her retrocalcaneal bursa with care taken to avoid intra-Achilles injection, she had a profoundly distended retrocalcaneal bursitis on ultrasound. She was pain-free after the injection, she will wear a cam boot for a solid week, continue heel lifts, and return to see me in 1 month.    ___________________________________________ Ihor Austin. Benjamin Stain, M.D., ABFM., CAQSM. Primary Care and Sports Medicine [facility]  Adjunct Instructor of Family Medicine  [employer]

## 2020-05-09 ENCOUNTER — Telehealth (INDEPENDENT_AMBULATORY_CARE_PROVIDER_SITE_OTHER): Payer: BC Managed Care – PPO | Admitting: Sports Medicine

## 2020-05-09 DIAGNOSIS — M67972 Unspecified disorder of synovium and tendon, left ankle and foot: Secondary | ICD-10-CM | POA: Diagnosis not present

## 2020-05-09 NOTE — Progress Notes (Signed)
   Virtual Visit via WebEx/MyChart   I connected with  Gina Morrison  on 05/09/20 via WebEx/MyChart/Doximity Video and verified that I am speaking with the correct person using two identifiers.   I discussed the limitations, risks, security and privacy concerns of performing an evaluation and management service by WebEx/MyChart/Doximity Video, including the higher likelihood of inaccurate diagnosis and treatment, and the availability of in person appointments.  We also discussed the likely need of an additional face to face encounter for complete and high quality delivery of care.  I also discussed with the patient that there may be a patient responsible charge related to this service. The patient expressed understanding and wishes to proceed.  Provider location is either at home or medical facility. Patient location is at their home, different from provider location. People involved in care of the patient during this telehealth encounter were myself, my nurse/medical assistant, and my front office/scheduling team member.  Review of Systems: No fevers, chills, night sweats, weight loss, chest pain, or shortness of breath.   Objective Findings:    General: Speaking full sentences, no audible heavy breathing.  Sounds alert and appropriately interactive.  Appears well.  Face symmetric.  Extraocular movements intact.  Pupils equal and round.  No nasal flaring or accessory muscle use visualized.  Independent interpretation of tests performed by another provider:   None.  Brief History, Exam, Impression, and Recommendations:    Insertional Achilles tendinopathy, left Gina Morrison returns, she is a pleasant 59 year old female, chronic left heel pain, on exam at the last visit this resembled retrocalcaneal bursitis, she had failed physical therapy, topical nitroglycerin, heel lifts, immobilization, after a year of pain we injected her retrocalcaneal bursa at the last visit, her bursa was profoundly  distended, maybe the largest I have ever seen. She returns today with miraculous improvement, pain-free, working out again, happy with how things are going, she can return to see me as needed.   I discussed the above assessment and treatment plan with the patient. The patient was provided an opportunity to ask questions and all were answered. The patient agreed with the plan and demonstrated an understanding of the instructions.   The patient was advised to call back or seek an in-person evaluation if the symptoms worsen or if the condition fails to improve as anticipated.   I provided 30 minutes of face to face and non-face-to-face time during this encounter date, time was needed to gather information, review chart, records, communicate/coordinate with staff remotely, as well as complete documentation.   ___________________________________________ Ihor Austin. Benjamin Stain, M.D., ABFM., CAQSM. Primary Care and Sports Medicine [facility]  Adjunct Instructor of Family Medicine  [employer]

## 2020-05-09 NOTE — Assessment & Plan Note (Signed)
Gina Morrison returns, she is a pleasant 59 year old female, chronic left heel pain, on exam at the last visit this resembled retrocalcaneal bursitis, she had failed physical therapy, topical nitroglycerin, heel lifts, immobilization, after a year of pain we injected her retrocalcaneal bursa at the last visit, her bursa was profoundly distended, maybe the largest I have ever seen. She returns today with miraculous improvement, pain-free, working out again, happy with how things are going, she can return to see me as needed.

## 2020-05-15 DIAGNOSIS — E032 Hypothyroidism due to medicaments and other exogenous substances: Secondary | ICD-10-CM | POA: Diagnosis not present

## 2020-05-15 DIAGNOSIS — R42 Dizziness and giddiness: Secondary | ICD-10-CM | POA: Diagnosis not present

## 2020-05-15 DIAGNOSIS — I493 Ventricular premature depolarization: Secondary | ICD-10-CM | POA: Diagnosis not present

## 2020-05-15 DIAGNOSIS — R002 Palpitations: Secondary | ICD-10-CM | POA: Diagnosis not present

## 2020-07-04 DIAGNOSIS — E348 Other specified endocrine disorders: Secondary | ICD-10-CM | POA: Diagnosis not present

## 2020-07-04 DIAGNOSIS — R7989 Other specified abnormal findings of blood chemistry: Secondary | ICD-10-CM | POA: Diagnosis not present

## 2020-07-04 DIAGNOSIS — N951 Menopausal and female climacteric states: Secondary | ICD-10-CM | POA: Diagnosis not present

## 2020-07-04 DIAGNOSIS — R5383 Other fatigue: Secondary | ICD-10-CM | POA: Diagnosis not present

## 2020-07-04 DIAGNOSIS — E039 Hypothyroidism, unspecified: Secondary | ICD-10-CM | POA: Diagnosis not present

## 2020-07-04 DIAGNOSIS — E559 Vitamin D deficiency, unspecified: Secondary | ICD-10-CM | POA: Diagnosis not present

## 2020-07-04 DIAGNOSIS — E538 Deficiency of other specified B group vitamins: Secondary | ICD-10-CM | POA: Diagnosis not present

## 2020-07-16 DIAGNOSIS — R7989 Other specified abnormal findings of blood chemistry: Secondary | ICD-10-CM | POA: Diagnosis not present

## 2020-07-16 DIAGNOSIS — E039 Hypothyroidism, unspecified: Secondary | ICD-10-CM | POA: Diagnosis not present

## 2020-07-16 DIAGNOSIS — N951 Menopausal and female climacteric states: Secondary | ICD-10-CM | POA: Diagnosis not present

## 2020-07-16 DIAGNOSIS — F419 Anxiety disorder, unspecified: Secondary | ICD-10-CM | POA: Diagnosis not present

## 2020-07-30 ENCOUNTER — Ambulatory Visit (INDEPENDENT_AMBULATORY_CARE_PROVIDER_SITE_OTHER): Payer: BC Managed Care – PPO | Admitting: Sports Medicine

## 2020-07-30 ENCOUNTER — Ambulatory Visit (INDEPENDENT_AMBULATORY_CARE_PROVIDER_SITE_OTHER): Payer: BC Managed Care – PPO

## 2020-07-30 ENCOUNTER — Other Ambulatory Visit: Payer: Self-pay

## 2020-07-30 DIAGNOSIS — R252 Cramp and spasm: Secondary | ICD-10-CM | POA: Insufficient documentation

## 2020-07-30 DIAGNOSIS — M67972 Unspecified disorder of synovium and tendon, left ankle and foot: Secondary | ICD-10-CM

## 2020-07-30 DIAGNOSIS — S86012A Strain of left Achilles tendon, initial encounter: Secondary | ICD-10-CM | POA: Diagnosis not present

## 2020-07-30 MED ORDER — MAGNESIUM OXIDE 400 MG PO TABS: 800.0000 mg | ORAL_TABLET | Freq: Every day | ORAL | 3 refills | Status: AC

## 2020-07-30 MED ORDER — MELOXICAM 15 MG PO TABS: ORAL_TABLET | ORAL | 3 refills | Status: AC

## 2020-07-30 NOTE — Assessment & Plan Note (Signed)
This is a very pleasant 59 year old female, at the last visit she was doing extremely well, we had done an injection into a profoundly distended retrocalcaneal bursa. She was doing extremely well until recently, did some activities and had a recurrence of pain and swelling. Due to recurrence of symptoms, nondiagnostic nature of an x-ray that she had performed at an outside facility, failure of injection we are going to proceed with MRI today. Adding meloxicam. Return to see me to go over MRI results. I do suspect we will be giving her another injection.

## 2020-07-30 NOTE — Assessment & Plan Note (Signed)
Gina Morrison is also have some bilateral gastrocnemius cramping, she is on some supplements with [facility] without too much improvement. I would like to x-ray her lumbar spine, and switch all of her magnesium supplements to magnesium oxide 800 nightly. Return to see me in a month for this.

## 2020-07-30 NOTE — Progress Notes (Signed)
    Procedures performed today:    None.  Independent interpretation of notes and tests performed by another provider:   None.  Brief History, Exam, Impression, and Recommendations:    Insertional Achilles tendinopathy, left This is a very pleasant 59 year old female, at the last visit she was doing extremely well, we had done an injection into a profoundly distended retrocalcaneal bursa. She was doing extremely well until recently, did some activities and had a recurrence of pain and swelling. Due to recurrence of symptoms, nondiagnostic nature of an x-ray that she had performed at an outside facility, failure of injection we are going to proceed with MRI today. Adding meloxicam. Return to see me to go over MRI results. I do suspect we will be giving her another injection.  Calf cramp Birdell is also have some bilateral gastrocnemius cramping, she is on some supplements with [facility] without too much improvement. I would like to x-ray her lumbar spine, and switch all of her magnesium supplements to magnesium oxide 800 nightly. Return to see me in a month for this.    ___________________________________________ Ihor Austin. Benjamin Stain, M.D., ABFM., CAQSM. Primary Care and Sports Medicine [place]  Adjunct Instructor of Family Medicine  [employer]

## 2020-08-14 ENCOUNTER — Ambulatory Visit: Payer: BC Managed Care – PPO | Admitting: Sports Medicine

## 2020-08-21 ENCOUNTER — Ambulatory Visit: Payer: BC Managed Care – PPO | Admitting: Sports Medicine

## 2020-08-21 DIAGNOSIS — L218 Other seborrheic dermatitis: Secondary | ICD-10-CM | POA: Diagnosis not present

## 2020-08-21 DIAGNOSIS — L648 Other androgenic alopecia: Secondary | ICD-10-CM | POA: Diagnosis not present

## 2020-08-21 DIAGNOSIS — D225 Melanocytic nevi of trunk: Secondary | ICD-10-CM | POA: Diagnosis not present

## 2020-08-21 DIAGNOSIS — L918 Other hypertrophic disorders of the skin: Secondary | ICD-10-CM | POA: Diagnosis not present

## 2020-08-24 ENCOUNTER — Ambulatory Visit (INDEPENDENT_AMBULATORY_CARE_PROVIDER_SITE_OTHER): Payer: BC Managed Care – PPO | Admitting: Sports Medicine

## 2020-08-24 ENCOUNTER — Ambulatory Visit (INDEPENDENT_AMBULATORY_CARE_PROVIDER_SITE_OTHER): Payer: BC Managed Care – PPO

## 2020-08-24 DIAGNOSIS — M67972 Unspecified disorder of synovium and tendon, left ankle and foot: Secondary | ICD-10-CM

## 2020-08-24 DIAGNOSIS — M7752 Other enthesopathy of left foot: Secondary | ICD-10-CM | POA: Diagnosis not present

## 2020-08-24 NOTE — Assessment & Plan Note (Signed)
Gina Morrison is a pleasant 59 year old female, she has a known retrocalcaneal bursitis, ultimately we obtained an MRI showed a distended retrocalcaneal bursa, there is also a large area of cartilage loss on her talus. Because her pain was predominantly at the Achilles insertion back in May we did a injection into the retrocalcaneal bursa with boot immobilization for a week. She did extremely well until recently, now having recurrence of pain, she did have a small distended retrocalcaneal bursa, this was injected followed by boot immobilization for 7 to 10 days, return to see me as needed. I see no role for surgical intervention at this point.

## 2020-08-24 NOTE — Progress Notes (Signed)
    Procedures performed today:    Procedure: Real-time Ultrasound Guided injection of the left retrocalcaneal bursa Device: Samsung HS60  Verbal informed consent obtained.  Time-out conducted.  Noted no overlying erythema, induration, or other signs of local infection.  Skin prepped in a sterile fashion.  Local anesthesia: Topical Ethyl chloride.  With sterile technique and under real time ultrasound guidance:  Noted mildly distended retrocalcaneal bursa, far less than the prior visit, using a 22-gauge needle advanced into the retrocalcaneal bursa, we are able to aspirate only a scant amount of thick fluid, syringe switched and 1/2 cc lidocaine, 1/2 cc kenalog 40 injected easily.   Completed without difficulty  Pain immediately resolved suggesting accurate placement of the medication.  Advised to call if fevers/chills, erythema, induration, drainage, or persistent bleeding.  Images permanently stored and available for review in PACS.  Impression: Technically successful ultrasound guided injection.  Independent interpretation of notes and tests performed by another provider:   None.  Brief History, Exam, Impression, and Recommendations:    Retrocalcaneal bursitis (back of heel), left Gina Morrison is a pleasant 59 year old female, she has a known retrocalcaneal bursitis, ultimately we obtained an MRI showed a distended retrocalcaneal bursa, there is also a large area of cartilage loss on her talus. Because her pain was predominantly at the Achilles insertion back in May we did a injection into the retrocalcaneal bursa with boot immobilization for a week. She did extremely well until recently, now having recurrence of pain, she did have a small distended retrocalcaneal bursa, this was injected followed by boot immobilization for 7 to 10 days, return to see me as needed. I see no role for surgical intervention at this point.    ___________________________________________ Ihor Austin.  Benjamin Stain, M.D., ABFM., CAQSM. Primary Care and Sports Medicine [place] [facility]  Adjunct Instructor of Family Medicine  [employer]

## 2020-10-26 DIAGNOSIS — R002 Palpitations: Secondary | ICD-10-CM | POA: Diagnosis not present

## 2020-10-26 DIAGNOSIS — I493 Ventricular premature depolarization: Secondary | ICD-10-CM | POA: Diagnosis not present

## 2020-10-26 DIAGNOSIS — R42 Dizziness and giddiness: Secondary | ICD-10-CM | POA: Diagnosis not present

## 2020-10-30 DIAGNOSIS — I493 Ventricular premature depolarization: Secondary | ICD-10-CM | POA: Diagnosis not present

## 2020-12-17 DIAGNOSIS — R002 Palpitations: Secondary | ICD-10-CM | POA: Diagnosis not present

## 2020-12-17 DIAGNOSIS — R0789 Other chest pain: Secondary | ICD-10-CM | POA: Diagnosis not present

## 2020-12-17 DIAGNOSIS — E032 Hypothyroidism due to medicaments and other exogenous substances: Secondary | ICD-10-CM | POA: Diagnosis not present

## 2020-12-17 DIAGNOSIS — I493 Ventricular premature depolarization: Secondary | ICD-10-CM | POA: Diagnosis not present

## 2020-12-18 DIAGNOSIS — R002 Palpitations: Secondary | ICD-10-CM | POA: Diagnosis not present

## 2021-01-04 DIAGNOSIS — N951 Menopausal and female climacteric states: Secondary | ICD-10-CM | POA: Diagnosis not present

## 2021-01-04 DIAGNOSIS — E039 Hypothyroidism, unspecified: Secondary | ICD-10-CM | POA: Diagnosis not present

## 2021-01-04 DIAGNOSIS — R7989 Other specified abnormal findings of blood chemistry: Secondary | ICD-10-CM | POA: Diagnosis not present

## 2021-01-04 DIAGNOSIS — E538 Deficiency of other specified B group vitamins: Secondary | ICD-10-CM | POA: Diagnosis not present

## 2021-01-04 DIAGNOSIS — E559 Vitamin D deficiency, unspecified: Secondary | ICD-10-CM | POA: Diagnosis not present

## 2021-01-04 DIAGNOSIS — F419 Anxiety disorder, unspecified: Secondary | ICD-10-CM | POA: Diagnosis not present

## 2021-01-04 DIAGNOSIS — M25569 Pain in unspecified knee: Secondary | ICD-10-CM | POA: Diagnosis not present

## 2021-01-14 DIAGNOSIS — R002 Palpitations: Secondary | ICD-10-CM | POA: Diagnosis not present

## 2021-01-14 DIAGNOSIS — N951 Menopausal and female climacteric states: Secondary | ICD-10-CM | POA: Diagnosis not present

## 2021-01-14 DIAGNOSIS — E039 Hypothyroidism, unspecified: Secondary | ICD-10-CM | POA: Diagnosis not present

## 2021-01-14 DIAGNOSIS — R7989 Other specified abnormal findings of blood chemistry: Secondary | ICD-10-CM | POA: Diagnosis not present

## 2021-01-14 DIAGNOSIS — R0789 Other chest pain: Secondary | ICD-10-CM | POA: Diagnosis not present

## 2021-01-14 DIAGNOSIS — F419 Anxiety disorder, unspecified: Secondary | ICD-10-CM | POA: Diagnosis not present

## 2021-01-14 DIAGNOSIS — E032 Hypothyroidism due to medicaments and other exogenous substances: Secondary | ICD-10-CM | POA: Diagnosis not present

## 2021-01-14 DIAGNOSIS — I493 Ventricular premature depolarization: Secondary | ICD-10-CM | POA: Diagnosis not present

## 2021-01-15 DIAGNOSIS — I781 Nevus, non-neoplastic: Secondary | ICD-10-CM | POA: Diagnosis not present

## 2021-01-15 DIAGNOSIS — L821 Other seborrheic keratosis: Secondary | ICD-10-CM | POA: Diagnosis not present

## 2021-01-15 DIAGNOSIS — D2372 Other benign neoplasm of skin of left lower limb, including hip: Secondary | ICD-10-CM | POA: Diagnosis not present

## 2021-01-15 DIAGNOSIS — L918 Other hypertrophic disorders of the skin: Secondary | ICD-10-CM | POA: Diagnosis not present

## 2021-01-30 IMAGING — MR MR HEEL *L* W/O CM
6 series · 40 of 40 positions shown · non-contrast
Comparison: None.

CLINICAL DATA: Left heel pain and swelling for 15 months

EXAM:
MR OF THE LEFT HEEL WITHOUT CONTRAST
TECHNIQUE: Multiplanar, multisequence MR imaging of the left ankle was
performed. No intravenous contrast was administered.

[Series 6: T2 fat-sat · axial · 3.0mm · 0.62mm/px · z∈[-50,+98]mm · 8 of 38 slices shown (1 of 2)]
[im 1/38]
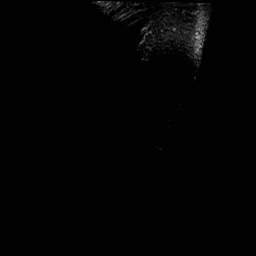
[im 6/38]
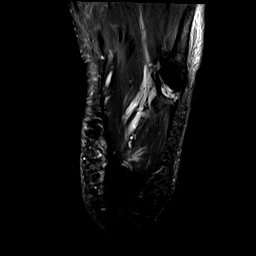
[im 11/38]
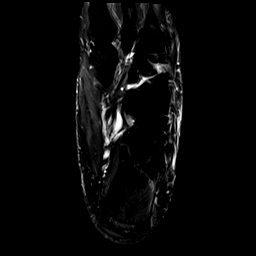
[im 16/38]
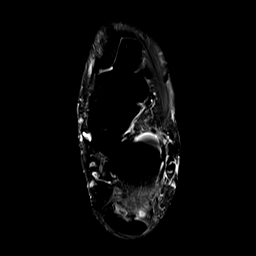
[im 22/38]
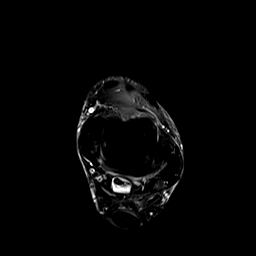
[im 27/38]
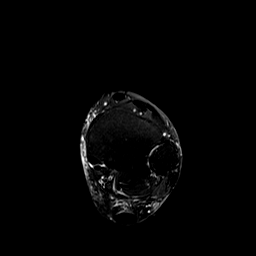
[im 32/38]
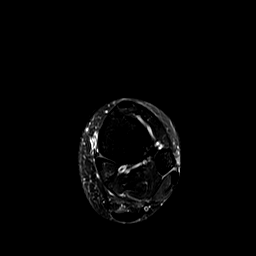
[im 38/38]
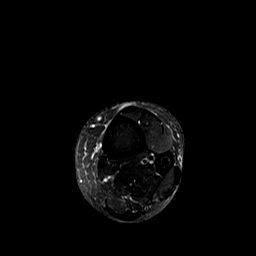

[Series 7: PD · axial · 3.0mm · 0.50mm/px · z∈[-50,+98]mm · 8 of 38 slices shown]
[im 1/38]
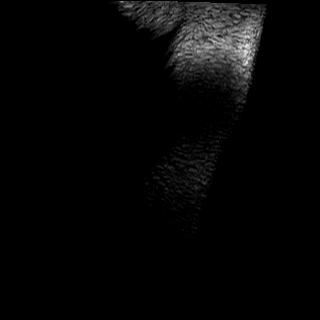
[im 6/38]
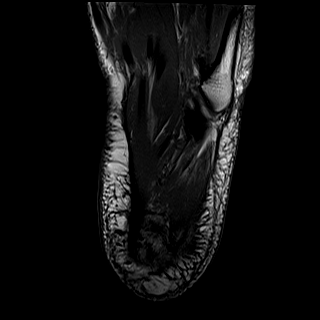
[im 11/38]
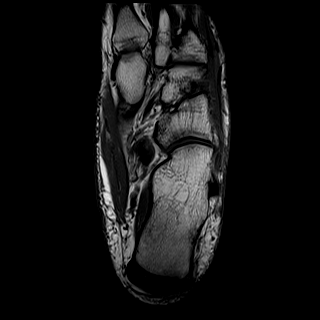
[im 16/38]
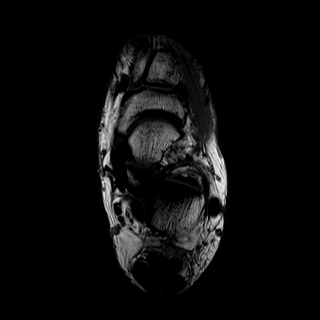
[im 22/38]
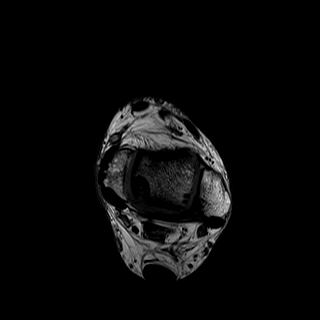
[im 27/38]
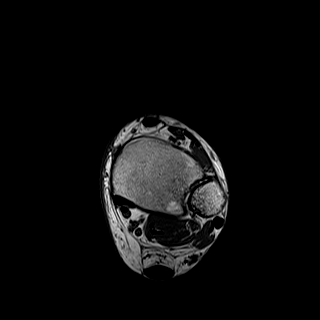
[im 32/38]
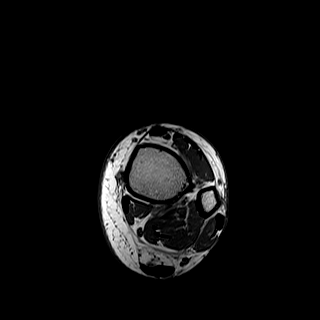
[im 38/38]
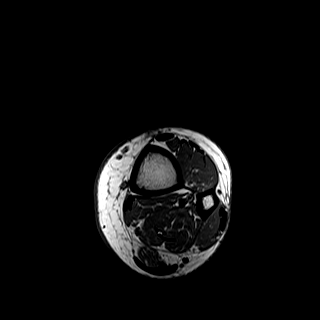

[Series 8: T2 fat-sat · coronal · 3.0mm · 0.62mm/px · 7 of 34 slices shown (2 of 2)]
[im 1/34]
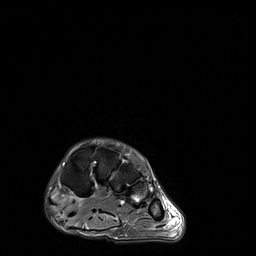
[im 6/34]
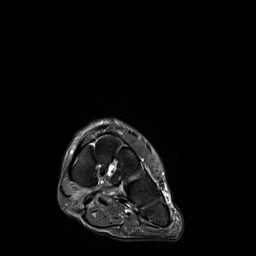
[im 12/34]
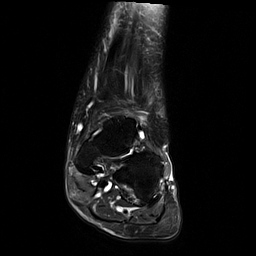
[im 17/34]
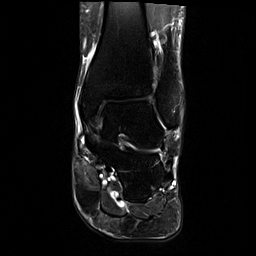
[im 23/34]
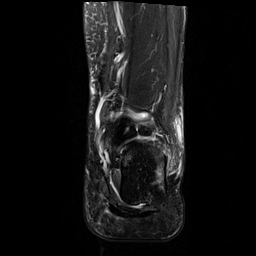
[im 28/34]
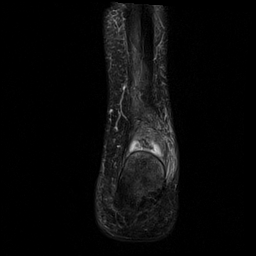
[im 34/34]
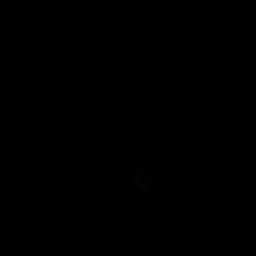

[Series 9: STIR · sagittal · 3.0mm · 0.62mm/px · 5 of 27 slices shown (1 of 2)]
[im 1/27]
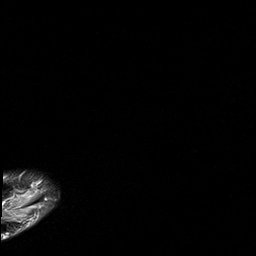
[im 7/27]
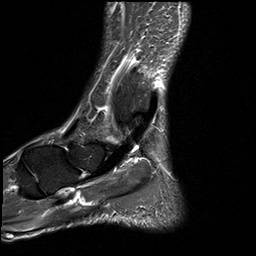
[im 14/27]
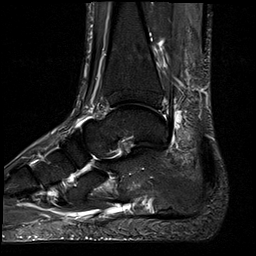
[im 20/27]
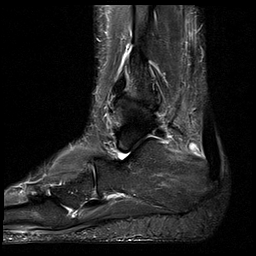
[im 27/27]
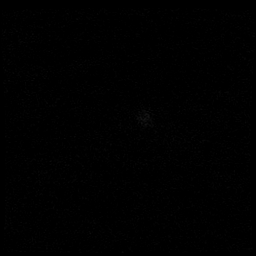

[Series 10: T1 · sagittal · 3.0mm · 0.62mm/px · 5 of 27 slices shown]
[im 1/27]
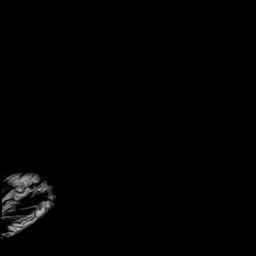
[im 7/27]
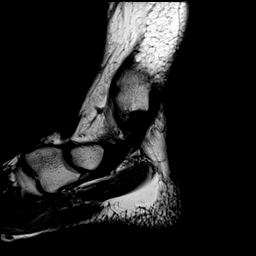
[im 14/27]
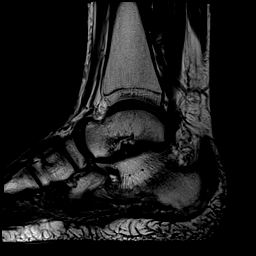
[im 20/27]
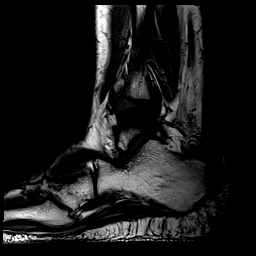
[im 27/27]
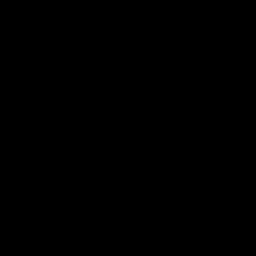

[Series 11: STIR · coronal · 3.0mm · 0.62mm/px · 7 of 34 slices shown (2 of 2)]
[im 1/34]
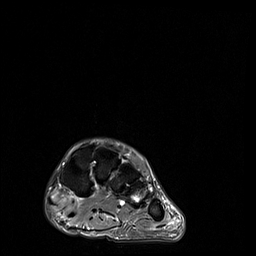
[im 6/34]
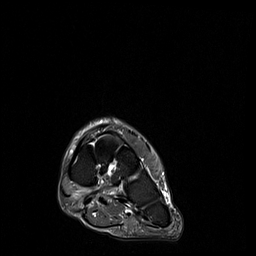
[im 12/34]
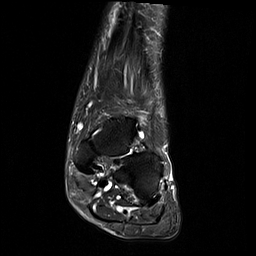
[im 17/34]
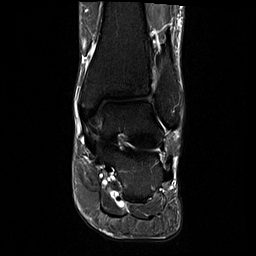
[im 23/34]
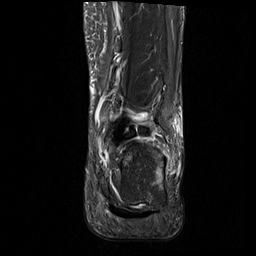
[im 28/34]
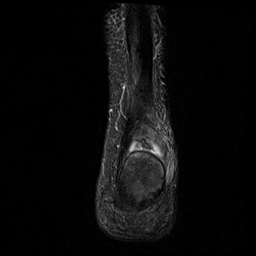
[im 34/34]
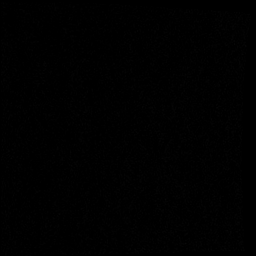

[40 of 40 positions shown; findings below may reference images not displayed]

FINDINGS: TENDONS

Peroneal: Peroneal longus tendon intact. Peroneal brevis intact.

Posteromedial: Posterior tibial tendon intact. Flexor hallucis
longus tendon intact. Flexor digitorum longus tendon intact.

Anterior: Tibialis anterior tendon intact. Extensor hallucis longus
tendon intact Extensor digitorum longus tendon intact.

Achilles: Severe thickening of the distal Achilles tendon with a
small partial tear along the ventral aspect with subcortical marrow
edema. Small amount of fluid in the retrocalcaneal bursa.

Plantar Fascia: Intact.

LIGAMENTS

Lateral: Anterior talofibular ligament intact. Calcaneofibular
ligament intact. Posterior talofibular ligament intact. Anterior and
posterior tibiofibular ligaments intact.

Medial: Deltoid ligament intact. Spring ligament intact.

CARTILAGE

Ankle Joint: No joint effusion. Normal ankle mortise. Focal
osteochondral lesion involving the lateral corner of the talar dome
with mild subchondral reactive marrow edema. 10 x 10 mm
osteochondral lesion involving the medial aspect of the talar dome
with partial-thickness overlying cartilage loss.

Subtalar Joints/Sinus Tarsi: Normal subtalar joints. No subtalar
joint effusion. Normal sinus tarsi.

Bones: Subcortical cystic changes in the plantar base of the fourth
metatarsal. No fracture or dislocation.

Soft Tissue: No fluid collection or hematoma. Muscles are normal
without edema or atrophy. Tarsal tunnel is normal.
IMPRESSION: 1. Moderate tendinosis of the distal Achilles tendon with a small
partial tear along the ventral aspect with subcortical marrow edema.
Mild retrocalcaneal bursitis.
2. A 10 x 10 mm osteochondral lesion involving the medial aspect of
the talar dome with partial-thickness overlying cartilage loss.
3. Focal osteochondral lesion involving the lateral corner of the
talar dome with mild subchondral reactive marrow edema.

## 2021-02-05 DIAGNOSIS — Z01419 Encounter for gynecological examination (general) (routine) without abnormal findings: Secondary | ICD-10-CM | POA: Diagnosis not present

## 2021-07-10 DIAGNOSIS — F419 Anxiety disorder, unspecified: Secondary | ICD-10-CM | POA: Diagnosis not present

## 2021-07-10 DIAGNOSIS — E039 Hypothyroidism, unspecified: Secondary | ICD-10-CM | POA: Diagnosis not present

## 2021-07-10 DIAGNOSIS — R7989 Other specified abnormal findings of blood chemistry: Secondary | ICD-10-CM | POA: Diagnosis not present

## 2021-07-10 DIAGNOSIS — E559 Vitamin D deficiency, unspecified: Secondary | ICD-10-CM | POA: Diagnosis not present

## 2021-07-10 DIAGNOSIS — E538 Deficiency of other specified B group vitamins: Secondary | ICD-10-CM | POA: Diagnosis not present

## 2021-07-10 DIAGNOSIS — N951 Menopausal and female climacteric states: Secondary | ICD-10-CM | POA: Diagnosis not present

## 2021-07-15 DIAGNOSIS — I493 Ventricular premature depolarization: Secondary | ICD-10-CM | POA: Diagnosis not present

## 2021-07-15 DIAGNOSIS — I491 Atrial premature depolarization: Secondary | ICD-10-CM | POA: Diagnosis not present

## 2021-07-15 DIAGNOSIS — R002 Palpitations: Secondary | ICD-10-CM | POA: Diagnosis not present

## 2021-07-18 DIAGNOSIS — F419 Anxiety disorder, unspecified: Secondary | ICD-10-CM | POA: Diagnosis not present

## 2021-07-18 DIAGNOSIS — E559 Vitamin D deficiency, unspecified: Secondary | ICD-10-CM | POA: Diagnosis not present

## 2021-07-18 DIAGNOSIS — N951 Menopausal and female climacteric states: Secondary | ICD-10-CM | POA: Diagnosis not present

## 2021-07-18 DIAGNOSIS — E039 Hypothyroidism, unspecified: Secondary | ICD-10-CM | POA: Diagnosis not present

## 2021-07-30 DIAGNOSIS — I493 Ventricular premature depolarization: Secondary | ICD-10-CM | POA: Diagnosis not present

## 2021-08-18 DIAGNOSIS — R008 Other abnormalities of heart beat: Secondary | ICD-10-CM | POA: Diagnosis not present

## 2021-08-18 DIAGNOSIS — I491 Atrial premature depolarization: Secondary | ICD-10-CM | POA: Diagnosis not present

## 2021-08-18 DIAGNOSIS — I471 Supraventricular tachycardia: Secondary | ICD-10-CM | POA: Diagnosis not present

## 2021-08-18 DIAGNOSIS — I493 Ventricular premature depolarization: Secondary | ICD-10-CM | POA: Diagnosis not present
